# Patient Record
Sex: Female | Born: 1990 | Race: Black or African American | Hispanic: No | Marital: Single | Smoking: Former smoker
Health system: Southern US, Community
[De-identification: ages and names within clinical notes are randomized; demographics above are authoritative.]

## PROBLEM LIST (undated history)

## (undated) ENCOUNTER — Inpatient Hospital Stay (HOSPITAL_COMMUNITY): Payer: Self-pay

## (undated) DIAGNOSIS — G43909 Migraine, unspecified, not intractable, without status migrainosus: Secondary | ICD-10-CM

## (undated) DIAGNOSIS — I10 Essential (primary) hypertension: Secondary | ICD-10-CM

## (undated) DIAGNOSIS — E669 Obesity, unspecified: Secondary | ICD-10-CM

## (undated) HISTORY — DX: Migraine, unspecified, not intractable, without status migrainosus: G43.909

## (undated) HISTORY — PX: NO PAST SURGERIES: SHX2092

---

## 2015-10-06 ENCOUNTER — Encounter (HOSPITAL_COMMUNITY): Payer: Self-pay

## 2015-10-06 ENCOUNTER — Emergency Department (HOSPITAL_COMMUNITY)
Admission: EM | Admit: 2015-10-06 | Discharge: 2015-10-06 | Disposition: A | Discharge status: Home / Self Care | Payer: Self-pay | Attending: Emergency Medicine | Admitting: Emergency Medicine

## 2015-10-06 DIAGNOSIS — N390 Urinary tract infection, site not specified: Secondary | ICD-10-CM | POA: Insufficient documentation

## 2015-10-06 DIAGNOSIS — I10 Essential (primary) hypertension: Secondary | ICD-10-CM | POA: Insufficient documentation

## 2015-10-06 HISTORY — DX: Essential (primary) hypertension: I10

## 2015-10-06 LAB — URINE MICROSCOPIC-ADD ON

## 2015-10-06 LAB — URINALYSIS, ROUTINE W REFLEX MICROSCOPIC
BILIRUBIN URINE: NEGATIVE
GLUCOSE, UA: NEGATIVE mg/dL
Ketones, ur: 15 mg/dL — AB
Nitrite: NEGATIVE
PH: 6 (ref 5.0–8.0)
Protein, ur: 30 mg/dL — AB
SPECIFIC GRAVITY, URINE: 1.035 — AB (ref 1.005–1.030)

## 2015-10-06 LAB — PREGNANCY, URINE: PREG TEST UR: NEGATIVE

## 2015-10-06 MED ORDER — SULFAMETHOXAZOLE-TRIMETHOPRIM 800-160 MG PO TABS: 2.0000 | ORAL_TABLET | Freq: Two times a day (BID) | ORAL | 0 refills | Status: DC

## 2015-10-06 MED ORDER — SULFAMETHOXAZOLE-TRIMETHOPRIM 800-160 MG PO TABS
1.0000 | ORAL_TABLET | Freq: Once | ORAL | Status: AC
  Administered 2015-10-06: 1 via ORAL
  Filled 2015-10-06: qty 1

## 2015-10-06 MED ORDER — PHENAZOPYRIDINE HCL 200 MG PO TABS: 200.0000 mg | ORAL_TABLET | Freq: Three times a day (TID) | ORAL | 0 refills | Status: DC

## 2015-10-06 MED ORDER — PHENAZOPYRIDINE HCL 100 MG PO TABS
100.0000 mg | ORAL_TABLET | Freq: Once | ORAL | Status: AC
  Administered 2015-10-06: 100 mg via ORAL
  Filled 2015-10-06: qty 1

## 2015-10-07 NOTE — ED Provider Notes (Signed)
[facility]-EMERGENCY DEPT Provider Note   CSN: [id number] Arrival date & time: 10/06/15  1811  First Provider Contact:  20:00pm      History   Chief Complaint Chief Complaint  Patient presents with  . Urinary Urgency    HPI Jill Orozco is a 25 y.o. female. She reports 24 hours of burning with urination. No frank hematuria. No fevers chills back pain no nausea vomiting. No vaginal bleeding or discharge less than 2. Was 2 weeks ago. One previous UTI. HPI  Past Medical History:  Diagnosis Date  . Hypertension     There are no active problems to display for this patient.   History reviewed. No pertinent surgical history.  OB History    No data available       Home Medications    Prior to Admission medications   Medication Sig Start Date End Date Taking? Authorizing Provider  calcium carbonate (TUMS EX) 750 MG chewable tablet Chew 4-6 tablets by mouth daily as needed for heartburn.   Yes Historical Provider, MD  hydrochlorothiazide (MICROZIDE) 12.5 MG capsule Take 12.5 mg by mouth daily.   Yes Historical Provider, MD  phenazopyridine (PYRIDIUM) 200 MG tablet Take 1 tablet (200 mg total) by mouth 3 (three) times daily. 10/06/15   Rolland Porter, MD  sulfamethoxazole-trimethoprim (BACTRIM DS,SEPTRA DS) 800-160 MG tablet Take 2 tablets by mouth 2 (two) times daily. 10/06/15   Rolland Porter, MD    Family History No family history on file.  Social History Social History  Substance Use Topics  . Smoking status: Never Smoker  . Smokeless tobacco: Never Used  . Alcohol use Not on file     Allergies   Review of patient's allergies indicates no known allergies.   Review of Systems Review of Systems  Constitutional: Negative for appetite change, chills, diaphoresis, fatigue and fever.  HENT: Negative for mouth sores, sore throat and trouble swallowing.   Eyes: Negative for visual disturbance.  Respiratory: Negative for cough, chest tightness, shortness of breath and  wheezing.   Cardiovascular: Negative for chest pain.  Gastrointestinal: Negative for abdominal distention, abdominal pain, diarrhea, nausea and vomiting.  Endocrine: Negative for polydipsia, polyphagia and polyuria.  Genitourinary: Positive for dysuria, frequency and urgency. Negative for hematuria.  Musculoskeletal: Negative for gait problem.  Skin: Negative for color change, pallor and rash.  Neurological: Negative for dizziness, syncope, light-headedness and headaches.  Hematological: Does not bruise/bleed easily.  Psychiatric/Behavioral: Negative for behavioral problems and confusion.     Physical Exam Updated Vital Signs BP 111/78   Pulse 90   Temp 98.5 F (36.9 C) (Oral)   Resp 20   Wt 296 lb 5 oz (134.4 kg)   LMP 09/20/2015   SpO2 100%   Physical Exam  Constitutional: She is oriented to person, place, and time. She appears well-developed and well-nourished. No distress.  HENT:  Head: Normocephalic.  Eyes: Conjunctivae are normal. Pupils are equal, round, and reactive to light. No scleral icterus.  Neck: Normal range of motion. Neck supple. No thyromegaly present.  Cardiovascular: Normal rate and regular rhythm.  Exam reveals no gallop and no friction rub.   No murmur heard. Pulmonary/Chest: Effort normal and breath sounds normal. No respiratory distress. She has no wheezes. She has no rales.  Abdominal: Soft. Bowel sounds are normal. She exhibits no distension. There is no tenderness. There is no rebound.  Musculoskeletal: Normal range of motion.  Neurological: She is alert and oriented to person, place, and time.  Skin: Skin is  warm and dry. No rash noted.  Psychiatric: She has a normal mood and affect. Her behavior is normal.     ED Treatments / Results  Labs (all labs ordered are listed, but only abnormal results are displayed) Labs Reviewed  URINALYSIS, ROUTINE W REFLEX MICROSCOPIC (NOT AT [facility]) - Abnormal; Notable for the following:       Result Value    APPearance CLOUDY (*)    Specific Gravity, Urine 1.035 (*)    Hgb urine dipstick MODERATE (*)    Ketones, ur 15 (*)    Protein, ur 30 (*)    Leukocytes, UA MODERATE (*)    All other components within normal limits  URINE MICROSCOPIC-ADD ON - Abnormal; Notable for the following:    Squamous Epithelial / LPF 0-5 (*)    Bacteria, UA FEW (*)    All other components within normal limits  PREGNANCY, URINE  POC URINE PREG, ED    EKG  EKG Interpretation None       Radiology No results found.  Procedures Procedures (including critical care time)  Medications Ordered in ED Medications  sulfamethoxazole-trimethoprim (BACTRIM DS,SEPTRA DS) 800-160 MG per tablet 1 tablet (1 tablet Oral Given 10/06/15 2218)  phenazopyridine (PYRIDIUM) tablet 100 mg (100 mg Oral Given 10/06/15 2218)     Initial Impression / Assessment and Plan / ED Course  I have reviewed the triage vital signs and the nursing notes.  Pertinent labs & imaging results that were available during my care of the patient were reviewed by me and considered in my medical decision making (see chart for details).  Clinical Course    Urine shows obvious infection. Culture pending. HCG negative. Given Bactrim Pyridium prescriptions for the same. Push fluids stay hydrated ER with acute changes. Routine primary care follow-up.  Final Clinical Impressions(s) / ED Diagnoses   Final diagnoses:  UTI (lower urinary tract infection)    New Prescriptions Discharge Medication List as of 10/06/2015 10:08 PM    START taking these medications   Details  phenazopyridine (PYRIDIUM) 200 MG tablet Take 1 tablet (200 mg total) by mouth 3 (three) times daily., Starting Sun 10/06/2015, Print    sulfamethoxazole-trimethoprim (BACTRIM DS,SEPTRA DS) 800-160 MG tablet Take 2 tablets by mouth 2 (two) times daily., Starting Sun 10/06/2015, Print         Rolland Porter, MD 10/07/15 [phone]

## 2015-12-03 ENCOUNTER — Other Ambulatory Visit: Payer: Self-pay | Admitting: *Deleted

## 2015-12-03 ENCOUNTER — Encounter: Payer: Self-pay | Admitting: *Deleted

## 2015-12-03 DIAGNOSIS — O161 Unspecified maternal hypertension, first trimester: Secondary | ICD-10-CM

## 2015-12-03 MED ORDER — LABETALOL HCL 200 MG PO TABS: 100.0000 mg | ORAL_TABLET | Freq: Two times a day (BID) | ORAL | 3 refills | Status: DC

## 2015-12-03 NOTE — Progress Notes (Signed)
Patient presents to clinic with question about her medication. She has a new ob visit scheduled next month and takes HCTZ 12.5 daily. She is concerned that now she is pregnant she shouldn't take it. Spoke with Dorathy KinsmanVirginia Smith, CNM who changed her to labetolol 100mg  bid. Patient needs to return in one week for a BP check. Discussed this with patient who voiced understanding.

## 2015-12-10 ENCOUNTER — Ambulatory Visit: Payer: Self-pay | Admitting: *Deleted

## 2015-12-10 VITALS — BP 116/91 | HR 100

## 2015-12-10 DIAGNOSIS — O10919 Unspecified pre-existing hypertension complicating pregnancy, unspecified trimester: Secondary | ICD-10-CM

## 2015-12-10 NOTE — Progress Notes (Signed)
Pt reports she is taking Labetalol as prescribed - (50 mg twice daily).  She denies H/A or visual disturbances. She reports occasional lower abdominal cramping. Consult w/Deirdre Poe CNM - pt to continue labetalol as prescribed and keep appt as scheduled on 10/19. Pt advised to go to [facility] if she develops severe abdominal pain or heavy bleeding like a period. Pt voiced understanding of all instructions.

## 2015-12-26 ENCOUNTER — Ambulatory Visit (INDEPENDENT_AMBULATORY_CARE_PROVIDER_SITE_OTHER): Payer: Medicaid Other | Admitting: Obstetrics and Gynecology

## 2015-12-26 ENCOUNTER — Encounter: Payer: Self-pay | Admitting: Obstetrics and Gynecology

## 2015-12-26 ENCOUNTER — Other Ambulatory Visit (HOSPITAL_COMMUNITY)
Admission: RE | Admit: 2015-12-26 | Discharge: 2015-12-26 | Disposition: A | Discharge status: Home / Self Care | Payer: Medicaid Other | Source: Ambulatory Visit | Attending: Obstetrics and Gynecology | Admitting: Obstetrics and Gynecology

## 2015-12-26 VITALS — BP 118/75 | HR 102 | Ht 64.0 in | Wt 295.5 lb

## 2015-12-26 DIAGNOSIS — Z113 Encounter for screening for infections with a predominantly sexual mode of transmission: Secondary | ICD-10-CM | POA: Diagnosis not present

## 2015-12-26 DIAGNOSIS — O10911 Unspecified pre-existing hypertension complicating pregnancy, first trimester: Secondary | ICD-10-CM | POA: Diagnosis present

## 2015-12-26 DIAGNOSIS — O10919 Unspecified pre-existing hypertension complicating pregnancy, unspecified trimester: Secondary | ICD-10-CM

## 2015-12-26 DIAGNOSIS — Z3687 Encounter for antenatal screening for uncertain dates: Secondary | ICD-10-CM

## 2015-12-26 DIAGNOSIS — Z124 Encounter for screening for malignant neoplasm of cervix: Secondary | ICD-10-CM

## 2015-12-26 DIAGNOSIS — Z01419 Encounter for gynecological examination (general) (routine) without abnormal findings: Secondary | ICD-10-CM | POA: Insufficient documentation

## 2015-12-26 DIAGNOSIS — O0992 Supervision of high risk pregnancy, unspecified, second trimester: Secondary | ICD-10-CM | POA: Insufficient documentation

## 2015-12-26 DIAGNOSIS — Z3400 Encounter for supervision of normal first pregnancy, unspecified trimester: Secondary | ICD-10-CM

## 2015-12-26 LAB — POCT URINALYSIS DIP (DEVICE)
BILIRUBIN URINE: NEGATIVE
Glucose, UA: NEGATIVE mg/dL
HGB URINE DIPSTICK: NEGATIVE
KETONES UR: NEGATIVE mg/dL
LEUKOCYTES UA: NEGATIVE
NITRITE: NEGATIVE
PH: 7 (ref 5.0–8.0)
Protein, ur: NEGATIVE mg/dL
Specific Gravity, Urine: 1.015 (ref 1.005–1.030)
Urobilinogen, UA: 0.2 mg/dL (ref 0.0–1.0)

## 2015-12-26 MED ORDER — ASPIRIN 81 MG PO CHEW: 81.0000 mg | CHEWABLE_TABLET | Freq: Every day | ORAL | 6 refills | Status: DC

## 2015-12-26 NOTE — Patient Instructions (Signed)
Hypertension During Pregnancy Hypertension, or high blood pressure, is when there is extra pressure inside your blood vessels that carry blood from the heart to the rest of your body (arteries). It can happen at any time in life, including pregnancy. Hypertension during pregnancy can cause problems for you and your baby. Your baby might not weigh as much as he or she should at birth or might be born early (premature). Very bad cases of hypertension during pregnancy can be life-threatening.  Different types of hypertension can occur during pregnancy. These include:  Chronic hypertension. This happens when a woman has hypertension before pregnancy and it continues during pregnancy.  Gestational hypertension. This is when hypertension develops during pregnancy.  Preeclampsia or toxemia of pregnancy. This is a very serious type of hypertension that develops only during pregnancy. It affects the whole body and can be very dangerous for both mother and baby.  Gestational hypertension and preeclampsia usually go away after your baby is born. Your blood pressure will likely stabilize within 6 weeks. Women who have hypertension during pregnancy have a greater chance of developing hypertension later in life or with future pregnancies. RISK FACTORS There are certain factors that make it more likely for you to develop hypertension during pregnancy. These include:  Having hypertension before pregnancy.  Having hypertension during a previous pregnancy.  Being overweight.  Being older than 40 years.  Being pregnant with more than one baby.  Having diabetes or kidney problems. SIGNS AND SYMPTOMS Chronic and gestational hypertension rarely cause symptoms. Preeclampsia has symptoms, which may include:  Increased protein in your urine. Your health care provider will check for this at every prenatal visit.  Swelling of your hands and face.  Rapid weight gain.  Headaches.  Visual changes.  Being  bothered by light.  Abdominal pain, especially in the upper right area.  Chest pain.  Shortness of breath.  Increased reflexes.  Seizures. These occur with a more severe form of preeclampsia, called eclampsia. DIAGNOSIS  You may be diagnosed with hypertension during a regular prenatal exam. At each prenatal visit, you may have:  Your blood pressure checked.  A urine test to check for protein in your urine. The type of hypertension you are diagnosed with depends on when you developed it. It also depends on your specific blood pressure reading.  Developing hypertension before 20 weeks of pregnancy is consistent with chronic hypertension.  Developing hypertension after 20 weeks of pregnancy is consistent with gestational hypertension.  Hypertension with increased urinary protein is diagnosed as preeclampsia.  Blood pressure measurements that stay above 160 systolic or 110 diastolic are a sign of severe preeclampsia. TREATMENT Treatment for hypertension during pregnancy varies. Treatment depends on the type of hypertension and how serious it is.  If you take medicine for chronic hypertension, you may need to switch medicines.  Medicines called ACE inhibitors should not be taken during pregnancy.  Low-dose aspirin may be suggested for women who have risk factors for preeclampsia.  If you have gestational hypertension, you may need to take a blood pressure medicine that is safe during pregnancy. Your health care provider will recommend the correct medicine.  If you have severe preeclampsia, you may need to be in the hospital. Health care providers will watch you and your baby very closely. You also may need to take medicine called magnesium sulfate to prevent seizures and lower blood pressure.  Sometimes, an early delivery is needed. This may be the case if the condition worsens. It would be   done to protect you and your baby. The only cure for preeclampsia is delivery.  Your health  care provider may recommend that you take one low-dose aspirin (81 mg) each day to help prevent high blood pressure during your pregnancy if you are at risk for preeclampsia. You may be at risk for preeclampsia if:  You had preeclampsia or eclampsia during a previous pregnancy.  Your baby did not grow as expected during a previous pregnancy.  You experienced preterm birth with a previous pregnancy.  You experienced a separation of the placenta from the uterus (placental abruption) during a previous pregnancy.  You experienced the loss of your baby during a previous pregnancy.  You are pregnant with more than one baby.  You have other medical conditions, such as diabetes or an autoimmune disease. HOME CARE INSTRUCTIONS  Schedule and keep all of your regular prenatal care appointments. This is important.  Take medicines only as directed by your health care provider. Tell your health care provider about all medicines you take.  Eat as little salt as possible.  Get regular exercise.  Do not drink alcohol.  Do not use tobacco products.  Do not drink products with caffeine.  Lie on your left side when resting. SEEK IMMEDIATE MEDICAL CARE IF:  You have severe abdominal pain.  You have sudden swelling in your hands, ankles, or face.  You gain 4 pounds (1.8 kg) or more in 1 week.  You vomit repeatedly.  You have vaginal bleeding.  You do not feel your baby moving as much.  You have a headache.  You have blurred or double vision.  You have muscle twitching or spasms.  You have shortness of breath.  You have blue fingernails or lips.  You have blood in your urine. MAKE SURE YOU:  Understand these instructions.  Will watch your condition.  Will get help right away if you are not doing well or get worse.   This information is not intended to replace advice given to you by your health care provider. Make sure you discuss any questions you have with your health care  provider.   Document Released: 11/11/2010 Document Revised: 03/16/2014 Document Reviewed: 09/22/2012 Elsevier Interactive Patient Education [employer]  First Trimester of Pregnancy The first trimester of pregnancy is from week 1 until the end of week 12 (months 1 through 3). A week after a sperm fertilizes an egg, the egg will implant on the wall of the uterus. This embryo will begin to develop into a baby. Genes from you and your partner are forming the baby. The female genes determine whether the baby is a boy or a girl. At 6-8 weeks, the eyes and face are formed, and the heartbeat can be seen on ultrasound. At the end of 12 weeks, all the baby's organs are formed.  Now that you are pregnant, you will want to do everything you can to have a healthy baby. Two of the most important things are to get good prenatal care and to follow your health care provider's instructions. Prenatal care is all the medical care you receive before the baby's birth. This care will help prevent, find, and treat any problems during the pregnancy and childbirth. BODY CHANGES Your body goes through many changes during pregnancy. The changes vary from woman to woman.   You may gain or lose a couple of pounds at first.  You may feel sick to your stomach (nauseous) and throw up (vomit). If the vomiting is uncontrollable,  call your health care provider.  You may tire easily.  You may develop headaches that can be relieved by medicines approved by your health care provider.  You may urinate more often. Painful urination may mean you have a bladder infection.  You may develop heartburn as a result of your pregnancy.  You may develop constipation because certain hormones are causing the muscles that push waste through your intestines to slow down.  You may develop hemorrhoids or swollen, bulging veins (varicose veins).  Your breasts may begin to grow larger and become tender. Your nipples may stick out more, and  the tissue that surrounds them (areola) may become darker.  Your gums may bleed and may be sensitive to brushing and flossing.  Dark spots or blotches (chloasma, mask of pregnancy) may develop on your face. This will likely fade after the baby is born.  Your menstrual periods will stop.  You may have a loss of appetite.  You may develop cravings for certain kinds of food.  You may have changes in your emotions from day to day, such as being excited to be pregnant or being concerned that something may go wrong with the pregnancy and baby.  You may have more vivid and strange dreams.  You may have changes in your hair. These can include thickening of your hair, rapid growth, and changes in texture. Some women also have hair loss during or after pregnancy, or hair that feels dry or thin. Your hair will most likely return to normal after your baby is born. WHAT TO EXPECT AT YOUR PRENATAL VISITS During a routine prenatal visit:  You will be weighed to make sure you and the baby are growing normally.  Your blood pressure will be taken.  Your abdomen will be measured to track your baby's growth.  The fetal heartbeat will be listened to starting around week 10 or 12 of your pregnancy.  Test results from any previous visits will be discussed. Your health care provider may ask you:  How you are feeling.  If you are feeling the baby move.  If you have had any abnormal symptoms, such as leaking fluid, bleeding, severe headaches, or abdominal cramping.  If you are using any tobacco products, including cigarettes, chewing tobacco, and electronic cigarettes.  If you have any questions. Other tests that may be performed during your first trimester include:  Blood tests to find your blood type and to check for the presence of any previous infections. They will also be used to check for low iron levels (anemia) and Rh antibodies. Later in the pregnancy, blood tests for diabetes will be done  along with other tests if problems develop.  Urine tests to check for infections, diabetes, or protein in the urine.  An ultrasound to confirm the proper growth and development of the baby.  An amniocentesis to check for possible genetic problems.  Fetal screens for spina bifida and Down syndrome.  You may need other tests to make sure you and the baby are doing well.  HIV (human immunodeficiency virus) testing. Routine prenatal testing includes screening for HIV, unless you choose not to have this test. HOME CARE INSTRUCTIONS  Medicines  Follow your health care provider's instructions regarding medicine use. Specific medicines may be either safe or unsafe to take during pregnancy.  Take your prenatal vitamins as directed.  If you develop constipation, try taking a stool softener if your health care provider approves. Diet  Eat regular, well-balanced meals. Choose a variety of  foods, such as meat or vegetable-based protein, fish, milk and low-fat dairy products, vegetables, fruits, and whole grain breads and cereals. Your health care provider will help you determine the amount of weight gain that is right for you.  Avoid raw meat and uncooked cheese. These carry germs that can cause birth defects in the baby.  Eating four or five small meals rather than three large meals a day may help relieve nausea and vomiting. If you start to feel nauseous, eating a few soda crackers can be helpful. Drinking liquids between meals instead of during meals also seems to help nausea and vomiting.  If you develop constipation, eat more high-fiber foods, such as fresh vegetables or fruit and whole grains. Drink enough fluids to keep your urine clear or pale yellow. Activity and Exercise  Exercise only as directed by your health care provider. Exercising will help you:  Control your weight.  Stay in shape.  Be prepared for labor and delivery.  Experiencing pain or cramping in the lower abdomen or  low back is a good sign that you should stop exercising. Check with your health care provider before continuing normal exercises.  Try to avoid standing for long periods of time. Move your legs often if you must stand in one place for a long time.  Avoid heavy lifting.  Wear low-heeled shoes, and practice good posture.  You may continue to have sex unless your health care provider directs you otherwise. Relief of Pain or Discomfort  Wear a good support bra for breast tenderness.   Take warm sitz baths to soothe any pain or discomfort caused by hemorrhoids. Use hemorrhoid cream if your health care provider approves.   Rest with your legs elevated if you have leg cramps or low back pain.  If you develop varicose veins in your legs, wear support hose. Elevate your feet for 15 minutes, 3-4 times a day. Limit salt in your diet. Prenatal Care  Schedule your prenatal visits by the twelfth week of pregnancy. They are usually scheduled monthly at first, then more often in the last 2 months before delivery.  Write down your questions. Take them to your prenatal visits.  Keep all your prenatal visits as directed by your health care provider. Safety  Wear your seat belt at all times when driving.  Make a list of emergency phone numbers, including numbers for family, friends, the hospital, and police and fire departments. General Tips  Ask your health care provider for a referral to a local prenatal education class. Begin classes no later than at the beginning of month 6 of your pregnancy.  Ask for help if you have counseling or nutritional needs during pregnancy. Your health care provider can offer advice or refer you to specialists for help with various needs.  Do not use hot tubs, steam rooms, or saunas.  Do not douche or use tampons or scented sanitary pads.  Do not cross your legs for long periods of time.  Avoid cat litter boxes and soil used by cats. These carry germs that can  cause birth defects in the baby and possibly loss of the fetus by miscarriage or stillbirth.  Avoid all smoking, herbs, alcohol, and medicines not prescribed by your health care provider. Chemicals in these affect the formation and growth of the baby.  Do not use any tobacco products, including cigarettes, chewing tobacco, and electronic cigarettes. If you need help quitting, ask your health care provider. You may receive counseling support and other resources  to help you quit.  Schedule a dentist appointment. At home, brush your teeth with a soft toothbrush and be gentle when you floss. SEEK MEDICAL CARE IF:   You have dizziness.  You have mild pelvic cramps, pelvic pressure, or nagging pain in the abdominal area.  You have persistent nausea, vomiting, or diarrhea.  You have a bad smelling vaginal discharge.  You have pain with urination.  You notice increased swelling in your face, hands, legs, or ankles. SEEK IMMEDIATE MEDICAL CARE IF:   You have a fever.  You are leaking fluid from your vagina.  You have spotting or bleeding from your vagina.  You have severe abdominal cramping or pain.  You have rapid weight gain or loss.  You vomit blood or material that looks like coffee grounds.  You are exposed to [place] measles and have never had them.  You are exposed to fifth disease or chickenpox.  You develop a severe headache.  You have shortness of breath.  You have any kind of trauma, such as from a fall or a car accident.   This information is not intended to replace advice given to you by your health care provider. Make sure you discuss any questions you have with your health care provider.   Document Released: 02/17/2001 Document Revised: 03/16/2014 Document Reviewed: 01/03/2013 Elsevier Interactive Patient Education [employer]

## 2015-12-26 NOTE — Progress Notes (Signed)
   PRENATAL VISIT NOTE  Subjective:  Jill Orozco is a 25 y.o. G1P0 at 96w5dbeing seen today for initial prenatal care.  She is currently monitored for the following issues for this high-risk pregnancy and has Chronic hypertension during pregnancy, antepartum and Supervision of normal pregnancy on her problem list.   Pt was taking 266mHCTZ prior to pregancy. She has transitioned to 10029mf labetolol BID. Her blood pressure has been well controlled  Patient reports no complaints.  Contractions: Not present. Vag. Bleeding: None.  Movement: Present. Denies leaking of fluid.   The following portions of the patient's history were reviewed and updated as appropriate: allergies, current medications, past family history, past medical history, past social history, past surgical history and problem list. Problem list updated.  Objective:   Vitals:   12/26/15 1304 12/26/15 1306  BP: 118/75   Pulse: (!) 102   Weight: 295 lb 8 oz (134 kg)   Height:  _0  (1.626 m)    Fetal Status: Fetal Heart Rate (bpm): 158   Movement: Present     General:  Alert, oriented and cooperative. Patient is in no acute distress.  Skin: Skin is warm and dry. No rash noted.   Cardiovascular: Normal heart rate noted  Respiratory: Normal respiratory effort, no problems with respiration noted  Abdomen: Soft, gravid, appropriate for gestational age. Pain/Pressure: Present     Pelvic:  Cervical exam deferred        Extremities: Normal range of motion.  Edema: Trace  Mental Status: Normal mood and affect. Normal behavior. Normal judgment and thought content.   Assessment and Plan:  Pregnancy: G1P0 at 13w37w5d Supervision of normal first pregnancy, antepartum - Glucose Tolerance, 1 HR (50g) - Prenatal Profile - Hemoglobinopathy Evaluation - GC[facility]lam[facility] - PAP - Pain Mgmt, Profile 6 Conf w/o mM, U - Culture, OB Urine - US OKoreaComp Less 14 Wks; Future - Protein  / Creatinine Ratio, Urine - Comp Met (CMET)  2. Chronic hypertension during pregnancy, antepartum Pt with cHTN, controlled at this time on labetolol 100mg43m - Protein / Creatinine Ratio, Urine - CMP -start baby asa daily  3. Unsure of LMP (last menstrual period) as reason for ultrasound scan Pt uterus exams >13 wks which her LMP sugests. Addtionally she reports fetal movement. Will get US.  [place]US OBKoreaomp Less 14 Wks; Future  Preterm labor symptoms and general obstetric precautions including but not limited to vaginal bleeding, contractions, leaking of fluid and fetal movement were reviewed in detail with the patient. Please refer to After Visit Summary for other counseling recommendations.  No Follow-up on file.  NichoWaldemar Dickens

## 2015-12-26 NOTE — Progress Notes (Signed)
Here for initial prenatal visit.  Declines flu shot. Given prenatal education booklets.

## 2015-12-27 LAB — PRENATAL PROFILE (SOLSTAS)
ANTIBODY SCREEN: NEGATIVE
BASOS ABS: 42 {cells}/uL (ref 0–200)
BASOS PCT: 1 %
EOS ABS: 42 {cells}/uL (ref 15–500)
EOS PCT: 1 %
HCT: 34.8 % — ABNORMAL LOW (ref 35.0–45.0)
HEMOGLOBIN: 11.3 g/dL — AB (ref 11.7–15.5)
HEP B S AG: NEGATIVE
HIV 1&2 Ab, 4th Generation: NONREACTIVE
LYMPHS ABS: 924 {cells}/uL (ref 850–3900)
Lymphocytes Relative: 22 %
MCH: 26.8 pg — AB (ref 27.0–33.0)
MCHC: 32.5 g/dL (ref 32.0–36.0)
MCV: 82.7 fL (ref 80.0–100.0)
MONOS PCT: 14 %
MPV: 9.9 fL (ref 7.5–12.5)
Monocytes Absolute: 588 cells/uL (ref 200–950)
NEUTROS ABS: 2604 {cells}/uL (ref 1500–7800)
Neutrophils Relative %: 62 %
Platelets: 184 10*3/uL (ref 140–400)
RBC: 4.21 MIL/uL (ref 3.80–5.10)
RDW: 15.6 % — ABNORMAL HIGH (ref 11.0–15.0)
RUBELLA: 4.18 {index} — AB (ref <0.90)
Rh Type: POSITIVE
WBC: 4.2 10*3/uL (ref 3.8–10.8)

## 2015-12-27 LAB — COMPREHENSIVE METABOLIC PANEL
ALBUMIN: 4 g/dL (ref 3.6–5.1)
ALK PHOS: 35 U/L (ref 33–115)
ALT: 12 U/L (ref 6–29)
AST: 15 U/L (ref 10–30)
BILIRUBIN TOTAL: 0.6 mg/dL (ref 0.2–1.2)
BUN: 5 mg/dL — AB (ref 7–25)
CHLORIDE: 104 mmol/L (ref 98–110)
CO2: 24 mmol/L (ref 20–31)
CREATININE: 0.53 mg/dL (ref 0.50–1.10)
Calcium: 9.3 mg/dL (ref 8.6–10.2)
Glucose, Bld: 95 mg/dL (ref 65–99)
Potassium: 3.8 mmol/L (ref 3.5–5.3)
SODIUM: 137 mmol/L (ref 135–146)
TOTAL PROTEIN: 6.7 g/dL (ref 6.1–8.1)

## 2015-12-27 LAB — GC/CHLAMYDIA PROBE AMP (~~LOC~~) NOT AT ARMC
CHLAMYDIA, DNA PROBE: NEGATIVE
NEISSERIA GONORRHEA: NEGATIVE

## 2015-12-27 LAB — PAIN MGMT, PROFILE 6 CONF W/O MM, U
6 ACETYLMORPHINE: NEGATIVE ng/mL (ref <10)
ALCOHOL METABOLITES: NEGATIVE ng/mL (ref <500)
Amphetamines: NEGATIVE ng/mL (ref <500)
Barbiturates: NEGATIVE ng/mL (ref <300)
Benzodiazepines: NEGATIVE ng/mL (ref <100)
CREATININE: 179 mg/dL (ref >=20.0)
Cocaine Metabolite: NEGATIVE ng/mL (ref <150)
Marijuana Metabolite: NEGATIVE ng/mL (ref <20)
Methadone Metabolite: NEGATIVE ng/mL (ref <100)
OXIDANT: NEGATIVE ug/mL (ref <200)
Opiates: NEGATIVE ng/mL (ref <100)
Oxycodone: NEGATIVE ng/mL (ref <100)
PH: 6.5 (ref 4.5–9.0)
PLEASE NOTE: 0
Phencyclidine: NEGATIVE ng/mL (ref <25)

## 2015-12-27 LAB — GLUCOSE TOLERANCE, 1 HOUR (50G) W/O FASTING: Glucose, 1 Hr, gestational: 98 mg/dL (ref <140)

## 2015-12-27 LAB — PROTEIN / CREATININE RATIO, URINE
CREATININE, URINE: 211 mg/dL (ref 20–320)
Protein Creatinine Ratio: 81 mg/g creat (ref 21–161)
TOTAL PROTEIN, URINE: 17 mg/dL (ref 5–24)

## 2015-12-27 LAB — CYTOLOGY - PAP: Diagnosis: NEGATIVE

## 2015-12-28 LAB — CULTURE, OB URINE
COLONY COUNT: NO GROWTH
ORGANISM ID, BACTERIA: NO GROWTH

## 2015-12-30 LAB — HEMOGLOBINOPATHY EVALUATION
HEMATOCRIT: 34.8 % — AB (ref 35.0–45.0)
HEMOGLOBIN: 11.3 g/dL — AB (ref 11.7–15.5)
HGB A2 QUANT: 2.1 % (ref 1.8–3.5)
Hgb A: 96.9 % (ref >96.0)
Hgb F Quant: <1 % (ref <2.0)
MCH: 26.8 pg — ABNORMAL LOW (ref 27.0–33.0)
MCV: 82.7 fL (ref 80.0–100.0)
RBC: 4.21 MIL/uL (ref 3.80–5.10)
RDW: 15.6 % — ABNORMAL HIGH (ref 11.0–15.0)

## 2015-12-31 ENCOUNTER — Ambulatory Visit (HOSPITAL_COMMUNITY)
Admission: RE | Admit: 2015-12-31 | Discharge: 2015-12-31 | Disposition: A | Discharge status: Home / Self Care | Payer: Medicaid Other | Source: Ambulatory Visit | Attending: Obstetrics and Gynecology | Admitting: Obstetrics and Gynecology

## 2015-12-31 DIAGNOSIS — Z3687 Encounter for antenatal screening for uncertain dates: Secondary | ICD-10-CM

## 2015-12-31 DIAGNOSIS — Z3400 Encounter for supervision of normal first pregnancy, unspecified trimester: Secondary | ICD-10-CM

## 2015-12-31 DIAGNOSIS — Z362 Encounter for other antenatal screening follow-up: Secondary | ICD-10-CM | POA: Diagnosis present

## 2015-12-31 DIAGNOSIS — Z3A14 14 weeks gestation of pregnancy: Secondary | ICD-10-CM | POA: Diagnosis not present

## 2016-01-23 ENCOUNTER — Ambulatory Visit (INDEPENDENT_AMBULATORY_CARE_PROVIDER_SITE_OTHER): Payer: Medicaid Other | Admitting: Obstetrics and Gynecology

## 2016-01-23 ENCOUNTER — Other Ambulatory Visit: Payer: Self-pay

## 2016-01-23 VITALS — BP 134/69 | HR 107 | Wt 294.2 lb

## 2016-01-23 DIAGNOSIS — Z6841 Body Mass Index (BMI) 40.0 and over, adult: Secondary | ICD-10-CM

## 2016-01-23 DIAGNOSIS — O10919 Unspecified pre-existing hypertension complicating pregnancy, unspecified trimester: Secondary | ICD-10-CM

## 2016-01-23 DIAGNOSIS — O10912 Unspecified pre-existing hypertension complicating pregnancy, second trimester: Secondary | ICD-10-CM | POA: Diagnosis not present

## 2016-01-23 DIAGNOSIS — Z3400 Encounter for supervision of normal first pregnancy, unspecified trimester: Secondary | ICD-10-CM

## 2016-01-23 DIAGNOSIS — E669 Obesity, unspecified: Secondary | ICD-10-CM

## 2016-01-23 DIAGNOSIS — O99212 Obesity complicating pregnancy, second trimester: Secondary | ICD-10-CM

## 2016-01-23 DIAGNOSIS — O0992 Supervision of high risk pregnancy, unspecified, second trimester: Secondary | ICD-10-CM

## 2016-01-23 DIAGNOSIS — O99210 Obesity complicating pregnancy, unspecified trimester: Secondary | ICD-10-CM | POA: Insufficient documentation

## 2016-01-23 LAB — POCT URINALYSIS DIP (DEVICE)
BILIRUBIN URINE: NEGATIVE
Glucose, UA: NEGATIVE mg/dL
HGB URINE DIPSTICK: NEGATIVE
KETONES UR: NEGATIVE mg/dL
Leukocytes, UA: NEGATIVE
Nitrite: NEGATIVE
PH: 6.5 (ref 5.0–8.0)
PROTEIN: NEGATIVE mg/dL
SPECIFIC GRAVITY, URINE: 1.01 (ref 1.005–1.030)
Urobilinogen, UA: 0.2 mg/dL (ref 0.0–1.0)

## 2016-01-24 LAB — AFP, QUAD SCREEN
AFP: 34.6 ng/mL
Age Alone: 1:912 {titer}
CURR GEST AGE: 17.7 wk
HCG, Total: 25.5 IU/mL
INH: 122 pg/mL
Interpretation-AFP: NEGATIVE
MOM FOR HCG: 1.32
MoM for AFP: 1.23
MoM for INH: 1.06
Open Spina bifida: NEGATIVE
Osb Risk: 1:12300 {titer}
TRI 18 SCR RISK EST: NEGATIVE
UE3 VALUE: 0.82 ng/mL
uE3 Mom: 0.87

## 2016-01-24 NOTE — Progress Notes (Signed)
Prenatal Visit Note Date: 01/24/2016 [facility]  Subjective:  Jill Orozco is a 25 y.o. G1P0 at 6583w6d being seen today for ongoing prenatal care.  She is currently monitored for the following issues for this high-risk pregnancy and has Chronic hypertension during pregnancy, antepartum; Supervision of high risk pregnancy, antepartum, second trimester; Obesity in pregnancy; and BMI 50.0-59.9, adult (HCC) on her problem list.  Patient reports no complaints.   Contractions: Not present. Vag. Bleeding: None.  Movement: Present. Denies leaking of fluid.   The following portions of the patient's history were reviewed and updated as appropriate: allergies, current medications, past family history, past medical history, past social history, past surgical history and problem list. Problem list updated.  Objective:   Vitals:   01/23/16 1101 01/23/16 1110  BP: 130/84 134/69  Pulse: (!) 112 (!) 107  Weight: 294 lb 3.2 oz (133.4 kg)     Fetal Status: Fetal Heart Rate (bpm): 154   Movement: Present     General:  Alert, oriented and cooperative. Patient is in no acute distress.  Skin: Skin is warm and dry. No rash noted.   Cardiovascular: Normal heart rate noted  Respiratory: Normal respiratory effort, no problems with respiration noted  Abdomen: Soft, gravid, appropriate for gestational age. Pain/Pressure: Present     Pelvic:  Cervical exam deferred        Extremities: Normal range of motion.  Edema: Trace  Mental Status: Normal mood and affect. Normal behavior. Normal judgment and thought content.   Urinalysis:      Assessment and Plan:  Pregnancy: G1 at 2983w6d  1. Chronic hypertension during pregnancy, antepartum Baseline labs negative. Will get baseline ECG.  - EKG 12-Lead  2. Supervision of normal first pregnancy, antepartum Anatomy scan ordered for 18-20wks. Pt amenable to quad screen  - US MFM OB COMP + 14 WK; Future  3. Obesity in  pregnancy No change in plan of care. Early glucola negative.   Preterm labor symptoms and general obstetric precautions including but not limited to vaginal bleeding, contractions, leaking of fluid and fetal movement were reviewed in detail with the patient. Please refer to After Visit Summary for other counseling recommendations.  Return in about 3 weeks (around 02/13/2016).   Wilkinson Bingharlie Hanan Mcwilliams, MD

## 2016-02-06 ENCOUNTER — Other Ambulatory Visit: Payer: Self-pay | Admitting: Obstetrics and Gynecology

## 2016-02-06 ENCOUNTER — Ambulatory Visit (HOSPITAL_COMMUNITY)
Admission: RE | Admit: 2016-02-06 | Discharge: 2016-02-06 | Disposition: A | Discharge status: Home / Self Care | Payer: Medicaid Other | Source: Ambulatory Visit | Attending: Obstetrics and Gynecology | Admitting: Obstetrics and Gynecology

## 2016-02-06 ENCOUNTER — Encounter (HOSPITAL_COMMUNITY): Payer: Self-pay

## 2016-02-06 DIAGNOSIS — O0992 Supervision of high risk pregnancy, unspecified, second trimester: Secondary | ICD-10-CM

## 2016-02-06 DIAGNOSIS — Z3A19 19 weeks gestation of pregnancy: Secondary | ICD-10-CM | POA: Diagnosis not present

## 2016-02-06 DIAGNOSIS — O10919 Unspecified pre-existing hypertension complicating pregnancy, unspecified trimester: Secondary | ICD-10-CM

## 2016-02-06 DIAGNOSIS — O10912 Unspecified pre-existing hypertension complicating pregnancy, second trimester: Secondary | ICD-10-CM | POA: Insufficient documentation

## 2016-02-06 DIAGNOSIS — Z3400 Encounter for supervision of normal first pregnancy, unspecified trimester: Secondary | ICD-10-CM

## 2016-02-06 DIAGNOSIS — O99212 Obesity complicating pregnancy, second trimester: Secondary | ICD-10-CM

## 2016-02-06 DIAGNOSIS — Z6841 Body Mass Index (BMI) 40.0 and over, adult: Secondary | ICD-10-CM | POA: Diagnosis not present

## 2016-02-10 ENCOUNTER — Telehealth: Payer: Self-pay | Admitting: *Deleted

## 2016-02-10 ENCOUNTER — Other Ambulatory Visit (HOSPITAL_COMMUNITY): Payer: Self-pay | Admitting: *Deleted

## 2016-02-10 DIAGNOSIS — O10919 Unspecified pre-existing hypertension complicating pregnancy, unspecified trimester: Secondary | ICD-10-CM

## 2016-02-10 NOTE — Telephone Encounter (Signed)
Jill Orozco left a message this am stating she wants to know if she can get something for a cold- states she has had a cough for 2 weeks.   I called Hayden PedroLacresha and we discussed what meds she can take since she is pregnant and has hypertension. Delsym, robitussin plain/DM/ mucinex. She voices understanding. We also discussed she does not have a prenatal visit scheduled. Will send message for registars to call her with appt.

## 2016-02-14 ENCOUNTER — Ambulatory Visit (INDEPENDENT_AMBULATORY_CARE_PROVIDER_SITE_OTHER): Payer: Medicaid Other | Admitting: Family Medicine

## 2016-02-14 VITALS — BP 120/74 | HR 106 | Wt 294.2 lb

## 2016-02-14 DIAGNOSIS — O10912 Unspecified pre-existing hypertension complicating pregnancy, second trimester: Secondary | ICD-10-CM

## 2016-02-14 DIAGNOSIS — O10919 Unspecified pre-existing hypertension complicating pregnancy, unspecified trimester: Secondary | ICD-10-CM

## 2016-02-14 DIAGNOSIS — O0992 Supervision of high risk pregnancy, unspecified, second trimester: Secondary | ICD-10-CM

## 2016-02-14 NOTE — Progress Notes (Signed)
   PRENATAL VISIT NOTE  Subjective:  Jill Orozco is a 25 y.o. G1P0 at 8249w6d being seen today for ongoing prenatal care.  She is currently monitored for the following issues for this high-risk pregnancy and has Chronic hypertension during pregnancy, antepartum; Supervision of high risk pregnancy, antepartum, second trimester; Obesity in pregnancy; and BMI 50.0-59.9, adult (HCC) on her problem list.  Patient reports no bleeding and no contractions.  Contractions: Not present.  .  Movement: Present. Denies leaking of fluid.   The following portions of the patient's history were reviewed and updated as appropriate: allergies, current medications, past family history, past medical history, past social history, past surgical history and problem list. Problem list updated.  Objective:   Vitals:   02/14/16 0944  BP: 120/74  Pulse: (!) 106  Weight: 294 lb 3.2 oz (133.4 kg)    Fetal Status: Fetal Heart Rate (bpm): 151   Movement: Present     General:  Alert, oriented and cooperative. Patient is in no acute distress.  Skin: Skin is warm and dry. No rash noted.   Cardiovascular: Normal heart rate noted  Respiratory: Normal respiratory effort, no problems with respiration noted  Abdomen: Soft, gravid, appropriate for gestational age. Pain/Pressure: Absent     Pelvic:  Cervical exam deferred        Extremities: Normal range of motion.     Mental Status: Normal mood and affect. Normal behavior. Normal judgment and thought content.   Assessment and Plan:  Pregnancy: G1P0 at 249w6d  1. Supervision of high risk pregnancy, antepartum, second trimester FHT and FH normal.  2. Chronic hypertension during pregnancy, antepartum Continue Labetalol, ASA. US for growth on 1/11.  Preterm labor symptoms and general obstetric precautions including but not limited to vaginal bleeding, contractions, leaking of fluid and fetal movement were reviewed in detail with the patient. Please refer to After  Visit Summary for other counseling recommendations.  No Follow-up on file.   Levie HeritageJacob J Adarsh Mundorf, DO

## 2016-03-09 NOTE — L&D Delivery Note (Signed)
Delivery Note At 12:58 PM a viable female was delivered via  (Presentation: vertex; ROA).  APGAR: 9, 9; weight pending.   Placenta status: intact via Tomasa Blase.  Cord:  3VC with no complications.  Cord pH: n/a  Anesthesia:  epidural Episiotomy:  n/a Lacerations:  1st degree vaginal Suture Repair: 2.0 vicryl rapide Est. Blood Loss (mL):  250  Mom to postpartum.  Baby to Couplet care / Skin to Skin.  Raelyn Mora, M MSN, CNM 06/21/2016, 1:09 PM

## 2016-03-12 ENCOUNTER — Encounter: Payer: Medicaid Other | Admitting: Obstetrics and Gynecology

## 2016-03-19 ENCOUNTER — Encounter (HOSPITAL_COMMUNITY): Payer: Self-pay

## 2016-03-19 ENCOUNTER — Other Ambulatory Visit (HOSPITAL_COMMUNITY): Payer: Self-pay | Admitting: Maternal and Fetal Medicine

## 2016-03-19 ENCOUNTER — Ambulatory Visit (HOSPITAL_COMMUNITY)
Admission: RE | Admit: 2016-03-19 | Discharge: 2016-03-19 | Disposition: A | Discharge status: Home / Self Care | Payer: Medicaid Other | Source: Ambulatory Visit | Attending: Obstetrics and Gynecology | Admitting: Obstetrics and Gynecology

## 2016-03-19 DIAGNOSIS — O0992 Supervision of high risk pregnancy, unspecified, second trimester: Secondary | ICD-10-CM

## 2016-03-19 DIAGNOSIS — O10012 Pre-existing essential hypertension complicating pregnancy, second trimester: Secondary | ICD-10-CM | POA: Diagnosis not present

## 2016-03-19 DIAGNOSIS — O10919 Unspecified pre-existing hypertension complicating pregnancy, unspecified trimester: Secondary | ICD-10-CM

## 2016-03-19 DIAGNOSIS — Z3A25 25 weeks gestation of pregnancy: Secondary | ICD-10-CM | POA: Diagnosis not present

## 2016-03-19 DIAGNOSIS — O99212 Obesity complicating pregnancy, second trimester: Secondary | ICD-10-CM | POA: Insufficient documentation

## 2016-03-20 ENCOUNTER — Other Ambulatory Visit (HOSPITAL_COMMUNITY): Payer: Self-pay | Admitting: *Deleted

## 2016-03-20 DIAGNOSIS — O10919 Unspecified pre-existing hypertension complicating pregnancy, unspecified trimester: Secondary | ICD-10-CM

## 2016-03-23 ENCOUNTER — Ambulatory Visit (INDEPENDENT_AMBULATORY_CARE_PROVIDER_SITE_OTHER): Payer: Medicaid Other | Admitting: Family Medicine

## 2016-03-23 VITALS — BP 132/79 | HR 112 | Wt 295.0 lb

## 2016-03-23 DIAGNOSIS — O10919 Unspecified pre-existing hypertension complicating pregnancy, unspecified trimester: Secondary | ICD-10-CM

## 2016-03-23 DIAGNOSIS — O0992 Supervision of high risk pregnancy, unspecified, second trimester: Secondary | ICD-10-CM

## 2016-03-23 DIAGNOSIS — O10912 Unspecified pre-existing hypertension complicating pregnancy, second trimester: Secondary | ICD-10-CM

## 2016-03-23 NOTE — Patient Instructions (Addendum)
AREA PEDIATRIC/FAMILY PRACTICE PHYSICIANS  [facility] [address] [place], [place]  [address] Phone - [phone]   Fax - [phone]  [facility] [address] [place], [place] [address] Phone - [phone]   Fax - [phone]  JACK AMOS [address] [place], [place]  [address] Phone - [phone]   Fax - [phone]  [facility] [address] [place], [place]  [address] Phone - [phone]   Fax - [phone]  [facility] [address] [place], [place]  [address] Phone - [phone]   Fax - [phone]  [facility] [address] [place], [place]  [address] Phone - [phone]   Fax - [phone]  [facility] [place] [facility] [address] [place], [place]  [address] Phone - [phone]   Fax - [phone]  [facility] [address] [place], [address] [place], [place]  [address] Phone - [phone]   Fax - [phone]  [facility] AT [facility] [address] [place], [place]  [address] Phone - [phone]   Fax - [phone] [facility] [place] [address] [place], [place]  [address] Phone - [phone]   Fax - [phone]  [facility] [address] [place], [place]  [address] Phone - [phone]   Fax - [phone]  [facility] [address], [address] [place], [place]  [address] Phone - [phone]   Fax - [phone]  [facility] [address] [place], [place]  [address] Phone - [phone]   Fax - [phone]  [facility] [address], [address] [place], [place]  [address] Phone - [phone]  [facility] [address] [place], [place]  [address] Phone - [phone]   Fax - [phone]  [facility] [address] [place], [place]  [address] Phone - [phone]   Fax - [phone]  [facility] [address] [place], [place]  [address] Phone - [phone]   Fax - [phone]  [facility] [address] [place], [place]  [address] Phone - [phone]   Fax - [phone]   [facility] [address] [place], [place]  [address] Phone - [phone]   Fax - [phone]  [facility] [address] [place], [place]  [address] Phone - [phone]   Fax - [phone]  DAVID RUBIN [address] [place], [place]  [address] Phone - [phone]   Fax - [phone]  [facility] [address] [place], [place]  [address] Phone - [phone]   Fax - [phone]  [facility] Alita Chyle [address] [place], [place]  [address] Phone - [phone]   Fax - [phone] Gerarda Fraction [address] [place], [place]  [address] Phone - [phone]   Fax - [phone]  [facility] [address] [place], [place]  [address] Phone - [phone]   Fax - [phone]  [facility] [place] [address] [place], [place]  [address] Phone - [phone]   Fax - [phone]     Second Trimester of Pregnancy The second trimester is from week 13 through week 28 (months 4 through 6). The second trimester is often a time when you feel your best. Your  body has also adjusted to being pregnant, and you begin to feel better physically. Usually, morning sickness has lessened or quit completely, you may have more energy, and you may have an increase in appetite. The second trimester is also a time when the fetus is growing rapidly. At the end of the sixth month, the fetus is about 9 inches long and weighs about 1 pounds. You will likely begin to feel the baby move (quickening) between 18 and 20 weeks of the  pregnancy. Body changes during your second trimester Your body continues to go through many changes during your second trimester. The changes vary from woman to woman.  Your weight will continue to increase. You will notice your lower abdomen bulging out.  You may begin to get stretch marks on your hips, abdomen, and breasts.  You may develop headaches that can be relieved by medicines. The medicines should be approved by your health care provider.  You may urinate more often because the fetus is pressing on your bladder.  You may develop or continue to have heartburn as a result of your pregnancy.  You may develop constipation because certain hormones are causing the muscles that push waste through your intestines to slow down.  You may develop hemorrhoids or swollen, bulging veins (varicose veins).  You may have back pain. This is caused by:  Weight gain.  Pregnancy hormones that are relaxing the joints in your pelvis.  A shift in weight and the muscles that support your balance.  Your breasts will continue to grow and they will continue to become tender.  Your gums may bleed and may be sensitive to brushing and flossing.  Dark spots or blotches (chloasma, mask of pregnancy) may develop on your face. This will likely fade after the baby is born.  A dark line from your belly button to the pubic area (linea nigra) may appear. This will likely fade after the baby is born.  You may have changes in your hair. These can include thickening of your hair, rapid growth, and changes in texture. Some women also have hair loss during or after pregnancy, or hair that feels dry or thin. Your hair will most likely return to normal after your baby is born. What to expect at prenatal visits During a routine prenatal visit:  You will be weighed to make sure you and the fetus are growing normally.  Your blood pressure will be taken.  Your abdomen will be measured to track your baby's  growth.  The fetal heartbeat will be listened to.  Any test results from the previous visit will be discussed. Your health care provider may ask you:  How you are feeling.  If you are feeling the baby move.  If you have had any abnormal symptoms, such as leaking fluid, bleeding, severe headaches, or abdominal cramping.  If you are using any tobacco products, including cigarettes, chewing tobacco, and electronic cigarettes.  If you have any questions. Other tests that may be performed during your second trimester include:  Blood tests that check for:  Low iron levels (anemia).  Gestational diabetes (between 24 and 28 weeks).  Rh antibodies. This is to check for a protein on red blood cells (Rh factor).  Urine tests to check for infections, diabetes, or protein in the urine.  An ultrasound to confirm the proper growth and development of the baby.  An amniocentesis to check for possible genetic problems.  Fetal screens for spina bifida and Down syndrome.  HIV (human  immunodeficiency virus) testing. Routine prenatal testing includes screening for HIV, unless you choose not to have this test. Follow these instructions at home: Eating and drinking  Continue to eat regular, healthy meals.  Avoid raw meat, uncooked cheese, cat litter boxes, and soil used by cats. These carry germs that can cause birth defects in the baby.  Take your prenatal vitamins.  Take 1500-2000 mg of calcium daily starting at the 20th week of pregnancy until you deliver your baby.  If you develop constipation:  Take over-the-counter or prescription medicines.  Drink enough fluid to keep your urine clear or pale yellow.  Eat foods that are high in fiber, such as fresh fruits and vegetables, whole grains, and beans.  Limit foods that are high in fat and processed sugars, such as fried and sweet foods. Activity  Exercise only as directed by your health care provider. Experiencing uterine cramps is  a good sign to stop exercising.  Avoid heavy lifting, wear low heel shoes, and practice good posture.  Wear your seat belt at all times when driving.  Rest with your legs elevated if you have leg cramps or low back pain.  Wear a good support bra for breast tenderness.  Do not use hot tubs, steam rooms, or saunas. Lifestyle  Avoid all smoking, herbs, alcohol, and unprescribed drugs. These chemicals affect the formation and growth of the baby.  Do not use any products that contain nicotine or tobacco, such as cigarettes and e-cigarettes. If you need help quitting, ask your health care provider.  A sexual relationship may be continued unless your health care provider directs you otherwise. General instructions  Follow your health care provider's instructions regarding medicine use. There are medicines that are either safe or unsafe to take during pregnancy.  Take warm sitz baths to soothe any pain or discomfort caused by hemorrhoids. Use hemorrhoid cream if your health care provider approves.  If you develop varicose veins, wear support hose. Elevate your feet for 15 minutes, 3-4 times a day. Limit salt in your diet.  Visit your dentist if you have not gone yet during your pregnancy. Use a soft toothbrush to brush your teeth and be gentle when you floss.  Keep all follow-up prenatal visits as told by your health care provider. This is important. Contact a health care provider if:  You have dizziness.  You have mild pelvic cramps, pelvic pressure, or nagging pain in the abdominal area.  You have persistent nausea, vomiting, or diarrhea.  You have a bad smelling vaginal discharge.  You have pain with urination. Get help right away if:  You have a fever.  You are leaking fluid from your vagina.  You have spotting or bleeding from your vagina.  You have severe abdominal cramping or pain.  You have rapid weight gain or weight loss.  You have shortness of breath with chest  pain.  You notice sudden or extreme swelling of your face, hands, ankles, feet, or legs.  You have not felt your baby move in over an hour.  You have severe headaches that do not go away with medicine.  You have vision changes. Summary  The second trimester is from week 13 through week 28 (months 4 through 6). It is also a time when the fetus is growing rapidly.  Your body goes through many changes during pregnancy. The changes vary from woman to woman.  Avoid all smoking, herbs, alcohol, and unprescribed drugs. These chemicals affect the formation and growth your baby.  Do not use any tobacco products, such as cigarettes, chewing tobacco, and e-cigarettes. If you need help quitting, ask your health care provider.  Contact your health care provider if you have any questions. Keep all prenatal visits as told by your health care provider. This is important. This information is not intended to replace advice given to you by your health care provider. Make sure you discuss any questions you have with your health care provider. Document Released: 02/17/2001 Document Revised: 08/01/2015 Document Reviewed: 04/26/2012 Elsevier Interactive Patient Education  2017 [employer].   Breastfeeding Deciding to breastfeed is one of the best choices you can make for you and your baby. A change in hormones during pregnancy causes your breast tissue to grow and increases the number and size of your milk ducts. These hormones also allow proteins, sugars, and fats from your blood supply to make breast milk in your milk-producing glands. Hormones prevent breast milk from being released before your baby is born as well as prompt milk flow after birth. Once breastfeeding has begun, thoughts of your baby, as well as his or her sucking or crying, can stimulate the release of milk from your milk-producing glands. Benefits of breastfeeding For Your Baby  Your first milk (colostrum) helps your baby's digestive  system function better.  There are antibodies in your milk that help your baby fight off infections.  Your baby has a lower incidence of asthma, allergies, and sudden infant death syndrome.  The nutrients in breast milk are better for your baby than infant formulas and are designed uniquely for your baby's needs.  Breast milk improves your baby's brain development.  Your baby is less likely to develop other conditions, such as childhood obesity, asthma, or type 2 diabetes mellitus. For You  Breastfeeding helps to create a very special bond between you and your baby.  Breastfeeding is convenient. Breast milk is always available at the correct temperature and costs nothing.  Breastfeeding helps to burn calories and helps you lose the weight gained during pregnancy.  Breastfeeding makes your uterus contract to its prepregnancy size faster and slows bleeding (lochia) after you give birth.  Breastfeeding helps to lower your risk of developing type 2 diabetes mellitus, osteoporosis, and breast or ovarian cancer later in life. Signs that your baby is hungry Early Signs of Hunger  Increased alertness or activity.  Stretching.  Movement of the head from side to side.  Movement of the head and opening of the mouth when the corner of the mouth or cheek is stroked (rooting).  Increased sucking sounds, smacking lips, cooing, sighing, or squeaking.  Hand-to-mouth movements.  Increased sucking of fingers or hands. Late Signs of Hunger  Fussing.  Intermittent crying. Extreme Signs of Hunger  Signs of extreme hunger will require calming and consoling before your baby will be able to breastfeed successfully. Do not wait for the following signs of extreme hunger to occur before you initiate breastfeeding:  Restlessness.  A loud, strong cry.  Screaming. Breastfeeding basics  Breastfeeding Initiation  Find a comfortable place to sit or lie down, with your neck and back well  supported.  Place a pillow or rolled up blanket under your baby to bring him or her to the level of your breast (if you are seated). Nursing pillows are specially designed to help support your arms and your baby while you breastfeed.  Make sure that your baby's abdomen is facing your abdomen.  Gently massage your breast. With your fingertips, massage from your  chest wall toward your nipple in a circular motion. This encourages milk flow. You may need to continue this action during the feeding if your milk flows slowly.  Support your breast with 4 fingers underneath and your thumb above your nipple. Make sure your fingers are well away from your nipple and your baby's mouth.  Stroke your baby's lips gently with your finger or nipple.  When your baby's mouth is open wide enough, quickly bring your baby to your breast, placing your entire nipple and as much of the colored area around your nipple (areola) as possible into your baby's mouth.  More areola should be visible above your baby's upper lip than below the lower lip.  Your baby's tongue should be between his or her lower gum and your breast.  Ensure that your baby's mouth is correctly positioned around your nipple (latched). Your baby's lips should create a seal on your breast and be turned out (everted).  It is common for your baby to suck about 2-3 minutes in order to start the flow of breast milk. Latching  Teaching your baby how to latch on to your breast properly is very important. An improper latch can cause nipple pain and decreased milk supply for you and poor weight gain in your baby. Also, if your baby is not latched onto your nipple properly, he or she may swallow some air during feeding. This can make your baby fussy. Burping your baby when you switch breasts during the feeding can help to get rid of the air. However, teaching your baby to latch on properly is still the best way to prevent fussiness from swallowing air while  breastfeeding. Signs that your baby has successfully latched on to your nipple:  Silent tugging or silent sucking, without causing you pain.  Swallowing heard between every 3-4 sucks.  Muscle movement above and in front of his or her ears while sucking. Signs that your baby has not successfully latched on to nipple:  Sucking sounds or smacking sounds from your baby while breastfeeding.  Nipple pain. If you think your baby has not latched on correctly, slip your finger into the corner of your baby's mouth to break the suction and place it between your baby's gums. Attempt breastfeeding initiation again. Signs of Successful Breastfeeding  Signs from your baby:  A gradual decrease in the number of sucks or complete cessation of sucking.  Falling asleep.  Relaxation of his or her body.  Retention of a small amount of milk in his or her mouth.  Letting go of your breast by himself or herself. Signs from you:  Breasts that have increased in firmness, weight, and size 1-3 hours after feeding.  Breasts that are softer immediately after breastfeeding.  Increased milk volume, as well as a change in milk consistency and color by the fifth day of breastfeeding.  Nipples that are not sore, cracked, or bleeding. Signs That Your Pecola LeisureBaby is Getting Enough Milk  Wetting at least 1-2 diapers during the first 24 hours after birth.  Wetting at least 5-6 diapers every 24 hours for the first week after birth. The urine should be clear or pale yellow by 5 days after birth.  Wetting 6-8 diapers every 24 hours as your baby continues to grow and develop.  At least 3 stools in a 24-hour period by age 61 days. The stool should be soft and yellow.  At least 3 stools in a 24-hour period by age 81 days. The stool should be seedy  and yellow.  No loss of weight greater than 10% of birth weight during the first 47 days of age.  Average weight gain of 4-7 ounces (113-198 g) per week after age 820  days.  Consistent daily weight gain by age 82 days, without weight loss after the age of 2 weeks. After a feeding, your baby may spit up a small amount. This is common. Breastfeeding frequency and duration Frequent feeding will help you make more milk and can prevent sore nipples and breast engorgement. Breastfeed when you feel the need to reduce the fullness of your breasts or when your baby shows signs of hunger. This is called "breastfeeding on demand." Avoid introducing a pacifier to your baby while you are working to establish breastfeeding (the first 4-6 weeks after your baby is born). After this time you may choose to use a pacifier. Research has shown that pacifier use during the first year of a baby's life decreases the risk of sudden infant death syndrome (SIDS). Allow your baby to feed on each breast as long as he or she wants. Breastfeed until your baby is finished feeding. When your baby unlatches or falls asleep while feeding from the first breast, offer the second breast. Because newborns are often sleepy in the first few weeks of life, you may need to awaken your baby to get him or her to feed. Breastfeeding times will vary from baby to baby. However, the following rules can serve as a guide to help you ensure that your baby is properly fed:  Newborns (babies 62 weeks of age or younger) may breastfeed every 1-3 hours.  Newborns should not go longer than 3 hours during the day or 5 hours during the night without breastfeeding.  You should breastfeed your baby a minimum of 8 times in a 24-hour period until you begin to introduce solid foods to your baby at around 59 months of age. Breast milk pumping Pumping and storing breast milk allows you to ensure that your baby is exclusively fed your breast milk, even at times when you are unable to breastfeed. This is especially important if you are going back to work while you are still breastfeeding or when you are not able to be present during  feedings. Your lactation consultant can give you guidelines on how long it is safe to store breast milk. A breast pump is a machine that allows you to pump milk from your breast into a sterile bottle. The pumped breast milk can then be stored in a refrigerator or freezer. Some breast pumps are operated by hand, while others use electricity. Ask your lactation consultant which type will work best for you. Breast pumps can be purchased, but some hospitals and breastfeeding support groups lease breast pumps on a monthly basis. A lactation consultant can teach you how to hand express breast milk, if you prefer not to use a pump. Caring for your breasts while you breastfeed Nipples can become dry, cracked, and sore while breastfeeding. The following recommendations can help keep your breasts moisturized and healthy:  Avoid using soap on your nipples.  Wear a supportive bra. Although not required, special nursing bras and tank tops are designed to allow access to your breasts for breastfeeding without taking off your entire bra or top. Avoid wearing underwire-style bras or extremely tight bras.  Air dry your nipples for 3-21minutes after each feeding.  Use only cotton bra pads to absorb leaked breast milk. Leaking of breast milk between feedings is normal.  Use lanolin on your nipples after breastfeeding. Lanolin helps to maintain your skin's normal moisture barrier. If you use pure lanolin, you do not need to wash it off before feeding your baby again. Pure lanolin is not toxic to your baby. You may also hand express a few drops of breast milk and gently massage that milk into your nipples and allow the milk to air dry. In the first few weeks after giving birth, some women experience extremely full breasts (engorgement). Engorgement can make your breasts feel heavy, warm, and tender to the touch. Engorgement peaks within 3-5 days after you give birth. The following recommendations can help ease  engorgement:  Completely empty your breasts while breastfeeding or pumping. You may want to start by applying warm, moist heat (in the shower or with warm water-soaked hand towels) just before feeding or pumping. This increases circulation and helps the milk flow. If your baby does not completely empty your breasts while breastfeeding, pump any extra milk after he or she is finished.  Wear a snug bra (nursing or regular) or tank top for 1-2 days to signal your body to slightly decrease milk production.  Apply ice packs to your breasts, unless this is too uncomfortable for you.  Make sure that your baby is latched on and positioned properly while breastfeeding. If engorgement persists after 48 hours of following these recommendations, contact your health care provider or a Advertising copywriter. Overall health care recommendations while breastfeeding  Eat healthy foods. Alternate between meals and snacks, eating 3 of each per day. Because what you eat affects your breast milk, some of the foods may make your baby more irritable than usual. Avoid eating these foods if you are sure that they are negatively affecting your baby.  Drink milk, fruit juice, and water to satisfy your thirst (about 10 glasses a day).  Rest often, relax, and continue to take your prenatal vitamins to prevent fatigue, stress, and anemia.  Continue breast self-awareness checks.  Avoid chewing and smoking tobacco. Chemicals from cigarettes that pass into breast milk and exposure to secondhand smoke may harm your baby.  Avoid alcohol and drug use, including marijuana. Some medicines that may be harmful to your baby can pass through breast milk. It is important to ask your health care provider before taking any medicine, including all over-the-counter and prescription medicine as well as vitamin and herbal supplements. It is possible to become pregnant while breastfeeding. If birth control is desired, ask your health care  provider about options that will be safe for your baby. Contact a health care provider if:  You feel like you want to stop breastfeeding or have become frustrated with breastfeeding.  You have painful breasts or nipples.  Your nipples are cracked or bleeding.  Your breasts are red, tender, or warm.  You have a swollen area on either breast.  You have a fever or chills.  You have nausea or vomiting.  You have drainage other than breast milk from your nipples.  Your breasts do not become full before feedings by the fifth day after you give birth.  You feel sad and depressed.  Your baby is too sleepy to eat well.  Your baby is having trouble sleeping.  Your baby is wetting less than 3 diapers in a 24-hour period.  Your baby has less than 3 stools in a 24-hour period.  Your baby's skin or the white part of his or her eyes becomes yellow.  Your baby is not gaining weight  by 59 days of age. Get help right away if:  Your baby is overly tired (lethargic) and does not want to wake up and feed.  Your baby develops an unexplained fever. This information is not intended to replace advice given to you by your health care provider. Make sure you discuss any questions you have with your health care provider. Document Released: 02/23/2005 Document Revised: 08/07/2015 Document Reviewed: 08/17/2012 Elsevier Interactive Patient Education  2017 [employer].

## 2016-03-23 NOTE — Progress Notes (Signed)
   PRENATAL VISIT NOTE  Subjective:  Jill Orozco is a 26 y.o. G1P0 at 5536w2d being seen today for ongoing prenatal care.  She is currently monitored for the following issues for this high-risk pregnancy and has Chronic hypertension during pregnancy, antepartum; Supervision of high risk pregnancy, antepartum, second trimester; Obesity in pregnancy; and BMI 50.0-59.9, adult (HCC) on her problem list.  Patient reports no complaints.  Contractions: Not present. Vag. Bleeding: None.  Movement: Present. Denies leaking of fluid.   The following portions of the patient's history were reviewed and updated as appropriate: allergies, current medications, past family history, past medical history, past social history, past surgical history and problem list. Problem list updated.  Objective:   Vitals:   03/23/16 1404  BP: 132/79  Pulse: (!) 112  Weight: 295 lb (133.8 kg)    Fetal Status: Fetal Heart Rate (bpm): 152 Fundal Height: 28 cm Movement: Present     General:  Alert, oriented and cooperative. Patient is in no acute distress.  Skin: Skin is warm and dry. No rash noted.   Cardiovascular: Normal heart rate noted  Respiratory: Normal respiratory effort, no problems with respiration noted  Abdomen: Soft, gravid, appropriate for gestational age. Pain/Pressure: Absent     Pelvic:  Cervical exam deferred        Extremities: Normal range of motion.  Edema: None  Mental Status: Normal mood and affect. Normal behavior. Normal judgment and thought content.   Assessment and Plan:  Pregnancy: G1P0 at 7036w2d  1. Chronic hypertension during pregnancy, antepartum BP good on Labetalol and continue Baby ASA Discussed risks of Pre-eclampsia  2. Supervision of high risk pregnancy, antepartum, second trimester 28 wk labs next--come fasting Re-visited flu shot--still considering.  Preterm labor symptoms and general obstetric precautions including but not limited to vaginal bleeding, contractions,  leaking of fluid and fetal movement were reviewed in detail with the patient. Please refer to After Visit Summary for other counseling recommendations.  Return in 2 weeks (on 04/06/2016) for 28 wk labs.   Reva Boresanya S Alli Jasmer, MD

## 2016-04-06 ENCOUNTER — Ambulatory Visit (INDEPENDENT_AMBULATORY_CARE_PROVIDER_SITE_OTHER): Payer: Medicaid Other | Admitting: Family Medicine

## 2016-04-06 VITALS — BP 125/75 | HR 102 | Wt 297.7 lb

## 2016-04-06 DIAGNOSIS — O10913 Unspecified pre-existing hypertension complicating pregnancy, third trimester: Secondary | ICD-10-CM | POA: Diagnosis not present

## 2016-04-06 DIAGNOSIS — O0992 Supervision of high risk pregnancy, unspecified, second trimester: Secondary | ICD-10-CM

## 2016-04-06 DIAGNOSIS — O0993 Supervision of high risk pregnancy, unspecified, third trimester: Secondary | ICD-10-CM

## 2016-04-06 DIAGNOSIS — Z23 Encounter for immunization: Secondary | ICD-10-CM

## 2016-04-06 DIAGNOSIS — O10919 Unspecified pre-existing hypertension complicating pregnancy, unspecified trimester: Secondary | ICD-10-CM

## 2016-04-06 LAB — CBC
HEMATOCRIT: 31.9 % — AB (ref 35.0–45.0)
Hemoglobin: 10.6 g/dL — ABNORMAL LOW (ref 11.7–15.5)
MCH: 28.9 pg (ref 27.0–33.0)
MCHC: 33.2 g/dL (ref 32.0–36.0)
MCV: 86.9 fL (ref 80.0–100.0)
MPV: 11.5 fL (ref 7.5–12.5)
Platelets: 157 10*3/uL (ref 140–400)
RBC: 3.67 MIL/uL — ABNORMAL LOW (ref 3.80–5.10)
RDW: 16.4 % — AB (ref 11.0–15.0)
WBC: 4.9 10*3/uL (ref 3.8–10.8)

## 2016-04-06 LAB — POCT URINALYSIS DIP (DEVICE)
BILIRUBIN URINE: NEGATIVE
Glucose, UA: NEGATIVE mg/dL
HGB URINE DIPSTICK: NEGATIVE
Ketones, ur: NEGATIVE mg/dL
Leukocytes, UA: NEGATIVE
NITRITE: NEGATIVE
PH: 7 (ref 5.0–8.0)
Protein, ur: NEGATIVE mg/dL
SPECIFIC GRAVITY, URINE: 1.01 (ref 1.005–1.030)
UROBILINOGEN UA: 0.2 mg/dL (ref 0.0–1.0)

## 2016-04-06 LAB — 2HR GTT W 1 HR, CARPENTER, 75 G
GLUCOSE, 2 HR, GEST: 94 mg/dL (ref <153)
GLUCOSE, FASTING, GEST: 86 mg/dL (ref 65–91)
Glucose, 1 Hr, Gest: 101 mg/dL (ref <180)

## 2016-04-06 MED ORDER — FAMOTIDINE 20 MG PO TABS: 20.0000 mg | ORAL_TABLET | Freq: Two times a day (BID) | ORAL | 3 refills | Status: DC

## 2016-04-06 NOTE — Progress Notes (Signed)
   PRENATAL VISIT NOTE  Subjective:  Jill Orozco is a 26 y.o. G1P0 at 12867w2d being seen today for ongoing prenatal care.  She is currently monitored for the following issues for this high-risk pregnancy and has Chronic hypertension during pregnancy, antepartum; Supervision of high risk pregnancy, antepartum, second trimester; Obesity in pregnancy; and BMI 50.0-59.9, adult (HCC) on her problem list.  Patient reports heartburn.  Contractions: Not present. Vag. Bleeding: None.  Movement: Present. Denies leaking of fluid.   The following portions of the patient's history were reviewed and updated as appropriate: allergies, current medications, past family history, past medical history, past social history, past surgical history and problem list. Problem list updated.  Objective:   Vitals:   04/06/16 0915  BP: 125/75  Pulse: (!) 102  Weight: 297 lb 11.2 oz (135 kg)    Fetal Status: Fetal Heart Rate (bpm): 154   Movement: Present     General:  Alert, oriented and cooperative. Patient is in no acute distress.  Skin: Skin is warm and dry. No rash noted.   Cardiovascular: Normal heart rate noted  Respiratory: Normal respiratory effort, no problems with respiration noted  Abdomen: Soft, gravid, appropriate for gestational age. Pain/Pressure: Absent     Pelvic:  Cervical exam deferred        Extremities: Normal range of motion.  Edema: None  Mental Status: Normal mood and affect. Normal behavior. Normal judgment and thought content.   Assessment and Plan:  Pregnancy: G1P0 at 2817w2d  1. Supervision of high risk pregnancy in third trimester FHT and FH normal> Pepcid for heartburn. - 2Hr GTT w/ 1 Hr Carpenter 75 g - HIV antibody (with reflex) - RPR - CBC  2. Need for Tdap vaccination - Tdap vaccine greater than or equal to 7yo IM  3. Chronic hypertension during pregnancy, antepartum EFW 60% - next US 2/9    Preterm labor symptoms and general obstetric precautions including but  not limited to vaginal bleeding, contractions, leaking of fluid and fetal movement were reviewed in detail with the patient. Please refer to After Visit Summary for other counseling recommendations.  No Follow-up on file.   Levie HeritageJacob J Stinson, DO

## 2016-04-06 NOTE — Progress Notes (Signed)
28 week labs, tdap today 

## 2016-04-07 LAB — HIV ANTIBODY (ROUTINE TESTING W REFLEX): HIV 1&2 Ab, 4th Generation: NONREACTIVE

## 2016-04-07 LAB — RPR

## 2016-04-17 ENCOUNTER — Other Ambulatory Visit (HOSPITAL_COMMUNITY): Payer: Self-pay | Admitting: Obstetrics and Gynecology

## 2016-04-17 ENCOUNTER — Ambulatory Visit (HOSPITAL_COMMUNITY)
Admission: RE | Admit: 2016-04-17 | Discharge: 2016-04-17 | Disposition: A | Discharge status: Home / Self Care | Payer: Medicaid Other | Source: Ambulatory Visit | Attending: Obstetrics and Gynecology | Admitting: Obstetrics and Gynecology

## 2016-04-17 ENCOUNTER — Encounter (HOSPITAL_COMMUNITY): Payer: Self-pay

## 2016-04-17 DIAGNOSIS — O99213 Obesity complicating pregnancy, third trimester: Secondary | ICD-10-CM

## 2016-04-17 DIAGNOSIS — O10919 Unspecified pre-existing hypertension complicating pregnancy, unspecified trimester: Secondary | ICD-10-CM

## 2016-04-17 DIAGNOSIS — O99212 Obesity complicating pregnancy, second trimester: Secondary | ICD-10-CM | POA: Diagnosis not present

## 2016-04-17 DIAGNOSIS — O10913 Unspecified pre-existing hypertension complicating pregnancy, third trimester: Secondary | ICD-10-CM | POA: Insufficient documentation

## 2016-04-17 DIAGNOSIS — Z3A29 29 weeks gestation of pregnancy: Secondary | ICD-10-CM

## 2016-04-17 DIAGNOSIS — Z362 Encounter for other antenatal screening follow-up: Secondary | ICD-10-CM | POA: Diagnosis present

## 2016-04-17 DIAGNOSIS — O0992 Supervision of high risk pregnancy, unspecified, second trimester: Secondary | ICD-10-CM

## 2016-04-17 DIAGNOSIS — Z6841 Body Mass Index (BMI) 40.0 and over, adult: Secondary | ICD-10-CM | POA: Insufficient documentation

## 2016-04-20 ENCOUNTER — Ambulatory Visit (INDEPENDENT_AMBULATORY_CARE_PROVIDER_SITE_OTHER): Payer: Medicaid Other | Admitting: Obstetrics and Gynecology

## 2016-04-20 VITALS — BP 149/85 | HR 80 | Wt 298.0 lb

## 2016-04-20 DIAGNOSIS — O0992 Supervision of high risk pregnancy, unspecified, second trimester: Secondary | ICD-10-CM

## 2016-04-20 DIAGNOSIS — O10913 Unspecified pre-existing hypertension complicating pregnancy, third trimester: Secondary | ICD-10-CM

## 2016-04-20 DIAGNOSIS — O10919 Unspecified pre-existing hypertension complicating pregnancy, unspecified trimester: Secondary | ICD-10-CM

## 2016-04-20 LAB — POCT URINALYSIS DIP (DEVICE)
BILIRUBIN URINE: NEGATIVE
Glucose, UA: NEGATIVE mg/dL
Hgb urine dipstick: NEGATIVE
Ketones, ur: NEGATIVE mg/dL
LEUKOCYTES UA: NEGATIVE
NITRITE: NEGATIVE
Protein, ur: NEGATIVE mg/dL
Specific Gravity, Urine: 1.01 (ref 1.005–1.030)
Urobilinogen, UA: 0.2 mg/dL (ref 0.0–1.0)
pH: 7 (ref 5.0–8.0)

## 2016-04-20 NOTE — Progress Notes (Signed)
   PRENATAL VISIT NOTE  Subjective:  Jill Orozco is a 26 y.o. G1P0 at 7261w2d being seen today for ongoing prenatal care.  She is currently monitored for the following issues for this high-risk pregnancy and has Chronic hypertension during pregnancy, antepartum; Supervision of high risk pregnancy, antepartum, second trimester; Obesity in pregnancy; and BMI 50.0-59.9, adult (HCC) on her problem list.  Patient reports no complaints. Patient systolic BP mildly elevated today to 143, continue to monitor closely. Repeat BP ordered for Wed.  Contractions: Not present. Vag. Bleeding: None.  Movement: Present. Denies leaking of fluid.   The following portions of the patient's history were reviewed and updated as appropriate: allergies, current medications, past family history, past medical history, past social history, past surgical history and problem list. Problem list updated.  Objective:   Vitals:   04/20/16 1016  BP: (!) 149/85  Pulse: 80  Weight: 298 lb (135.2 kg)    Fetal Status: Fetal Heart Rate (bpm): 150   Movement: Present     General:  Alert, oriented and cooperative. Patient is in no acute distress.  Skin: Skin is warm and dry. No rash noted.   Cardiovascular: Normal heart rate noted  Respiratory: Normal respiratory effort, no problems with respiration noted  Abdomen: Soft, gravid, appropriate for gestational age. Pain/Pressure: Absent     Pelvic:  Cervical exam deferred        Extremities: Normal range of motion.  Edema: None  Mental Status: Normal mood and affect. Normal behavior. Normal judgment and thought content.   Assessment and Plan:  Pregnancy: G1P0 at 6361w2d  1. Chronic hypertension during pregnancy, antepartum She does have mildly elevated BP today. Reports she has been taking her medicine BID as prescribed. UA is negative for protein today.  -have patient return Wed. For nurse visit BP check. If remains elevated would increase labetolol -Plan to start twice  weekly testing with next visit at 32 wks. -growth ultrasound normal 2/9 repeat ordered 3/9.  2. Supervision of high risk pregnancy, antepartum, second trimester Up to date on care.  Term labor symptoms and general obstetric precautions including but not limited to vaginal bleeding, contractions, leaking of fluid and fetal movement were reviewed in detail with the patient. Please refer to After Visit Summary for other counseling recommendations.  Return in about 2 weeks (around 05/04/2016) for NST w/ provider start twice weekly testing.   Lorne SkeensNicholas Michael Schenk, MD

## 2016-04-20 NOTE — Patient Instructions (Signed)
Third Trimester of Pregnancy The third trimester is from week 29 through week 40 (months 7 through 9). The third trimester is a time when the unborn baby (fetus) is growing rapidly. At the end of the ninth month, the fetus is about 20 inches in length and weighs 6-10 pounds. Body changes during your third trimester Your body goes through many changes during pregnancy. The changes vary from woman to woman. During the third trimester:  Your weight will continue to increase. You can expect to gain 25-35 pounds (11-16 kg) by the end of the pregnancy.  You may begin to get stretch marks on your hips, abdomen, and breasts.  You may urinate more often because the fetus is moving lower into your pelvis and pressing on your bladder.  You may develop or continue to have heartburn. This is caused by increased hormones that slow down muscles in the digestive tract.  You may develop or continue to have constipation because increased hormones slow digestion and cause the muscles that push waste through your intestines to relax.  You may develop hemorrhoids. These are swollen veins (varicose veins) in the rectum that can itch or be painful.  You may develop swollen, bulging veins (varicose veins) in your legs.  You may have increased body aches in the pelvis, back, or thighs. This is due to weight gain and increased hormones that are relaxing your joints.  You may have changes in your hair. These can include thickening of your hair, rapid growth, and changes in texture. Some women also have hair loss during or after pregnancy, or hair that feels dry or thin. Your hair will most likely return to normal after your baby is born.  Your breasts will continue to grow and they will continue to become tender. A yellow fluid (colostrum) may leak from your breasts. This is the first milk you are producing for your baby.  Your belly button may stick out.  You may notice more swelling in your hands, face, or  ankles.  You may have increased tingling or numbness in your hands, arms, and legs. The skin on your belly may also feel numb.  You may feel short of breath because of your expanding uterus.  You may have more problems sleeping. This can be caused by the size of your belly, increased need to urinate, and an increase in your body's metabolism.  You may notice the fetus "dropping," or moving lower in your abdomen.  You may have increased vaginal discharge.  Your cervix becomes thin and soft (effaced) near your due date. What to expect at prenatal visits You will have prenatal exams every 2 weeks until week 36. Then you will have weekly prenatal exams. During a routine prenatal visit:  You will be weighed to make sure you and the fetus are growing normally.  Your blood pressure will be taken.  Your abdomen will be measured to track your baby's growth.  The fetal heartbeat will be listened to.  Any test results from the previous visit will be discussed.  You may have a cervical check near your due date to see if you have effaced. At around 36 weeks, your health care provider will check your cervix. At the same time, your health care provider will also perform a test on the secretions of the vaginal tissue. This test is to determine if a type of bacteria, Group B streptococcus, is present. Your health care provider will explain this further. Your health care provider may ask you:    What your birth plan is.  How you are feeling.  If you are feeling the baby move.  If you have had any abnormal symptoms, such as leaking fluid, bleeding, severe headaches, or abdominal cramping.  If you are using any tobacco products, including cigarettes, chewing tobacco, and electronic cigarettes.  If you have any questions. Other tests or screenings that may be performed during your third trimester include:  Blood tests that check for low iron levels (anemia).  Fetal testing to check the health,  activity level, and growth of the fetus. Testing is done if you have certain medical conditions or if there are problems during the pregnancy.  Nonstress test (NST). This test checks the health of your baby to make sure there are no signs of problems, such as the baby not getting enough oxygen. During this test, a belt is placed around your belly. The baby is made to move, and its heart rate is monitored during movement. What is false labor? False labor is a condition in which you feel small, irregular tightenings of the muscles in the womb (contractions) that eventually go away. These are called Braxton Hicks contractions. Contractions may last for hours, days, or even weeks before true labor sets in. If contractions come at regular intervals, become more frequent, increase in intensity, or become painful, you should see your health care provider. What are the signs of labor?  Abdominal cramps.  Regular contractions that start at 10 minutes apart and become stronger and more frequent with time.  Contractions that start on the top of the uterus and spread down to the lower abdomen and back.  Increased pelvic pressure and dull back pain.  A watery or bloody mucus discharge that comes from the vagina.  Leaking of amniotic fluid. This is also known as your "water breaking." It could be a slow trickle or a gush. Let your doctor know if it has a color or strange odor. If you have any of these signs, call your health care provider right away, even if it is before your due date. Follow these instructions at home: Eating and drinking  Continue to eat regular, healthy meals.  Do not eat:  Raw meat or meat spreads.  Unpasteurized milk or cheese.  Unpasteurized juice.  Store-made salad.  Refrigerated smoked seafood.  Hot dogs or deli meat, unless they are piping hot.  More than 6 ounces of albacore tuna a week.  Shark, swordfish, king mackerel, or tile fish.  Store-made salads.  Raw  sprouts, such as mung bean or alfalfa sprouts.  Take prenatal vitamins as told by your health care provider.  Take 1000 mg of calcium daily as told by your health care provider.  If you develop constipation:  Take over-the-counter or prescription medicines.  Drink enough fluid to keep your urine clear or pale yellow.  Eat foods that are high in fiber, such as fresh fruits and vegetables, whole grains, and beans.  Limit foods that are high in fat and processed sugars, such as fried and sweet foods. Activity  Exercise only as directed by your health care provider. Healthy pregnant women should aim for 2 hours and 30 minutes of moderate exercise per week. If you experience any pain or discomfort while exercising, stop.  Avoid heavy lifting.  Do not exercise in extreme heat or humidity, or at high altitudes.  Wear low-heel, comfortable shoes.  Practice good posture.  Do not travel far distances unless it is absolutely necessary and only with the approval   of your health care provider.  Wear your seat belt at all times while in a car, on a bus, or on a plane.  Take frequent breaks and rest with your legs elevated if you have leg cramps or low back pain.  Do not use hot tubs, steam rooms, or saunas.  You may continue to have sex unless your health care provider tells you otherwise. Lifestyle  Do not use any products that contain nicotine or tobacco, such as cigarettes and e-cigarettes. If you need help quitting, ask your health care provider.  Do not drink alcohol.  Do not use any medicinal herbs or unprescribed drugs. These chemicals affect the formation and growth of the baby.  If you develop varicose veins:  Wear support pantyhose or compression stockings as told by your healthcare provider.  Elevate your feet for 15 minutes, 3-4 times a day.  Wear a supportive maternity bra to help with breast tenderness. General instructions  Take over-the-counter and prescription  medicines only as told by your health care provider. There are medicines that are either safe or unsafe to take during pregnancy.  Take warm sitz baths to soothe any pain or discomfort caused by hemorrhoids. Use hemorrhoid cream or witch hazel if your health care provider approves.  Avoid cat litter boxes and soil used by cats. These carry germs that can cause birth defects in the baby. If you have a cat, ask someone to clean the litter box for you.  To prepare for the arrival of your baby:  Take prenatal classes to understand, practice, and ask questions about the labor and delivery.  Make a trial run to the hospital.  Visit the hospital and tour the maternity area.  Arrange for maternity or paternity leave through employers.  Arrange for family and friends to take care of pets while you are in the hospital.  Purchase a rear-facing car seat and make sure you know how to install it in your car.  Pack your hospital bag.  Prepare the baby's nursery. Make sure to remove all pillows and stuffed animals from the baby's crib to prevent suffocation.  Visit your dentist if you have not gone during your pregnancy. Use a soft toothbrush to brush your teeth and be gentle when you floss.  Keep all prenatal follow-up visits as told by your health care provider. This is important. Contact a health care provider if:  You are unsure if you are in labor or if your water has broken.  You become dizzy.  You have mild pelvic cramps, pelvic pressure, or nagging pain in your abdominal area.  You have lower back pain.  You have persistent nausea, vomiting, or diarrhea.  You have an unusual or bad smelling vaginal discharge.  You have pain when you urinate. Get help right away if:  You have a fever.  You are leaking fluid from your vagina.  You have spotting or bleeding from your vagina.  You have severe abdominal pain or cramping.  You have rapid weight loss or weight gain.  You have  shortness of breath with chest pain.  You notice sudden or extreme swelling of your face, hands, ankles, feet, or legs.  Your baby makes fewer than 10 movements in 2 hours.  You have severe headaches that do not go away with medicine.  You have vision changes. Summary  The third trimester is from week 29 through week 40, months 7 through 9. The third trimester is a time when the unborn baby (fetus)   is growing rapidly.  During the third trimester, your discomfort may increase as you and your baby continue to gain weight. You may have abdominal, leg, and back pain, sleeping problems, and an increased need to urinate.  During the third trimester your breasts will keep growing and they will continue to become tender. A yellow fluid (colostrum) may leak from your breasts. This is the first milk you are producing for your baby.  False labor is a condition in which you feel small, irregular tightenings of the muscles in the womb (contractions) that eventually go away. These are called Braxton Hicks contractions. Contractions may last for hours, days, or even weeks before true labor sets in.  Signs of labor can include: abdominal cramps; regular contractions that start at 10 minutes apart and become stronger and more frequent with time; watery or bloody mucus discharge that comes from the vagina; increased pelvic pressure and dull back pain; and leaking of amniotic fluid. This information is not intended to replace advice given to you by your health care provider. Make sure you discuss any questions you have with your health care provider. Document Released: 02/17/2001 Document Revised: 08/01/2015 Document Reviewed: 04/26/2012 Elsevier Interactive Patient Education  2017 [employer]  

## 2016-04-21 ENCOUNTER — Other Ambulatory Visit (HOSPITAL_COMMUNITY): Payer: Self-pay | Admitting: *Deleted

## 2016-04-21 DIAGNOSIS — O10919 Unspecified pre-existing hypertension complicating pregnancy, unspecified trimester: Secondary | ICD-10-CM

## 2016-04-22 ENCOUNTER — Ambulatory Visit: Payer: Medicaid Other | Admitting: *Deleted

## 2016-04-22 VITALS — BP 125/76 | HR 101 | Wt 298.1 lb

## 2016-04-22 DIAGNOSIS — O10919 Unspecified pre-existing hypertension complicating pregnancy, unspecified trimester: Secondary | ICD-10-CM

## 2016-04-22 NOTE — Progress Notes (Signed)
Pt denies H/A or visual disturbances. Medication reconciliation completed. Pt advised to return to hospital for sx of pre-eclampsia or decreased fetal movement. Pt voiced understanding.

## 2016-04-30 ENCOUNTER — Encounter: Payer: Self-pay | Admitting: Obstetrics and Gynecology

## 2016-05-04 ENCOUNTER — Ambulatory Visit (INDEPENDENT_AMBULATORY_CARE_PROVIDER_SITE_OTHER): Payer: Medicaid Other | Admitting: Obstetrics & Gynecology

## 2016-05-04 VITALS — BP 123/71 | HR 98 | Wt 304.0 lb

## 2016-05-04 DIAGNOSIS — O10913 Unspecified pre-existing hypertension complicating pregnancy, third trimester: Secondary | ICD-10-CM

## 2016-05-04 DIAGNOSIS — E669 Obesity, unspecified: Secondary | ICD-10-CM

## 2016-05-04 DIAGNOSIS — O99213 Obesity complicating pregnancy, third trimester: Secondary | ICD-10-CM

## 2016-05-04 DIAGNOSIS — O0992 Supervision of high risk pregnancy, unspecified, second trimester: Secondary | ICD-10-CM

## 2016-05-04 DIAGNOSIS — O10919 Unspecified pre-existing hypertension complicating pregnancy, unspecified trimester: Secondary | ICD-10-CM

## 2016-05-04 DIAGNOSIS — O0993 Supervision of high risk pregnancy, unspecified, third trimester: Secondary | ICD-10-CM

## 2016-05-04 DIAGNOSIS — O99210 Obesity complicating pregnancy, unspecified trimester: Secondary | ICD-10-CM

## 2016-05-04 NOTE — Progress Notes (Signed)
US for growth scheduled 3/9, BPP added

## 2016-05-04 NOTE — Progress Notes (Signed)
   PRENATAL VISIT NOTE  Subjective:  Jill Orozco is a 26 y.o. G1P0 at 7177w2d being seen today for ongoing prenatal care.  She is currently monitored for the following issues for this high-risk pregnancy and has Chronic hypertension during pregnancy, antepartum; Supervision of high risk pregnancy, antepartum, second trimester; Obesity in pregnancy; and BMI 50.0-59.9, adult (HCC) on her problem list.  Patient reports no complaints.  Contractions: Not present. Vag. Bleeding: None.  Movement: Present. Denies leaking of fluid.   The following portions of the patient's history were reviewed and updated as appropriate: allergies, current medications, past family history, past medical history, past social history, past surgical history and problem list. Problem list updated.  Objective:   Vitals:   05/04/16 0912  BP: 123/71  Pulse: 98  Weight: (!) 304 lb (137.9 kg)    Fetal Status: Fetal Heart Rate (bpm): NST   Movement: Present     General:  Alert, oriented and cooperative. Patient is in no acute distress.  Skin: Skin is warm and dry. No rash noted.   Cardiovascular: Normal heart rate noted  Respiratory: Normal respiratory effort, no problems with respiration noted  Abdomen: Soft, gravid, appropriate for gestational age. Pain/Pressure: Present     Pelvic:  Cervical exam deferred        Extremities: Normal range of motion.  Edema: None  Mental Status: Normal mood and affect. Normal behavior. Normal judgment and thought content.   Assessment and Plan:  Pregnancy: G1P0 at 3577w2d  1. Chronic hypertension during pregnancy, antepartum  - US MFM FETAL BPP WO NON STRESS; Future - Fetal nonstress test  2. Supervision of high risk pregnancy in third trimester   3. Supervision of high risk pregnancy, antepartum, second trimester   4. Obesity in pregnancy   Preterm labor symptoms and general obstetric precautions including but not limited to vaginal bleeding, contractions, leaking  of fluid and fetal movement were reviewed in detail with the patient. Please refer to After Visit Summary for other counseling recommendations.  Return in about 7 days (around 05/11/2016) for Ob fu and NST.   Allie BossierMyra C Tykwon Fera, MD

## 2016-05-07 ENCOUNTER — Ambulatory Visit (INDEPENDENT_AMBULATORY_CARE_PROVIDER_SITE_OTHER): Payer: Medicaid Other | Admitting: Obstetrics & Gynecology

## 2016-05-07 ENCOUNTER — Ambulatory Visit: Payer: Self-pay

## 2016-05-07 VITALS — BP 118/74 | HR 112

## 2016-05-07 DIAGNOSIS — O10913 Unspecified pre-existing hypertension complicating pregnancy, third trimester: Secondary | ICD-10-CM

## 2016-05-07 DIAGNOSIS — Z3689 Encounter for other specified antenatal screening: Secondary | ICD-10-CM | POA: Diagnosis not present

## 2016-05-07 DIAGNOSIS — O10919 Unspecified pre-existing hypertension complicating pregnancy, unspecified trimester: Secondary | ICD-10-CM

## 2016-05-07 NOTE — Progress Notes (Signed)
Pt informed that the ultrasound is considered a limited OB ultrasound and is not intended to be a complete ultrasound exam.  Patient also informed that the ultrasound is not being completed with the intent of assessing for fetal or placental anomalies or any pelvic abnormalities.  Explained that the purpose of today's ultrasound is to assess for presentation and amniotic fluid volume.  Patient acknowledges the purpose of the exam and the limitations of the study.    

## 2016-05-11 ENCOUNTER — Ambulatory Visit (INDEPENDENT_AMBULATORY_CARE_PROVIDER_SITE_OTHER): Payer: Medicaid Other | Admitting: Advanced Practice Midwife

## 2016-05-11 ENCOUNTER — Other Ambulatory Visit: Payer: Medicaid Other | Admitting: Advanced Practice Midwife

## 2016-05-11 VITALS — BP 144/82 | HR 111 | Wt 304.0 lb

## 2016-05-11 DIAGNOSIS — O10919 Unspecified pre-existing hypertension complicating pregnancy, unspecified trimester: Secondary | ICD-10-CM

## 2016-05-11 DIAGNOSIS — O10913 Unspecified pre-existing hypertension complicating pregnancy, third trimester: Secondary | ICD-10-CM | POA: Diagnosis not present

## 2016-05-11 DIAGNOSIS — O0993 Supervision of high risk pregnancy, unspecified, third trimester: Secondary | ICD-10-CM

## 2016-05-11 LAB — POCT URINALYSIS DIP (DEVICE)
Glucose, UA: NEGATIVE mg/dL
HGB URINE DIPSTICK: NEGATIVE
Ketones, ur: NEGATIVE mg/dL
NITRITE: NEGATIVE
PH: 6 (ref 5.0–8.0)
Protein, ur: 30 mg/dL — AB
Specific Gravity, Urine: >=1.03 (ref 1.005–1.030)
UROBILINOGEN UA: 0.2 mg/dL (ref 0.0–1.0)

## 2016-05-11 NOTE — Patient Instructions (Signed)
Hypertension During Pregnancy °Hypertension, commonly called high blood pressure, is when the force of blood pumping through your arteries is too strong. Arteries are blood vessels that carry blood from the heart throughout the body. Hypertension during pregnancy can cause problems for you and your baby. Your baby may be born early (prematurely) or may not weigh as much as he or she should at birth. Very bad cases of hypertension during pregnancy can be life-threatening. °Different types of hypertension can occur during pregnancy. These include: °· Chronic hypertension. This happens when: °¨ You have hypertension before pregnancy and it continues during pregnancy. °¨ You develop hypertension before you are [redacted] weeks pregnant, and it continues during pregnancy. °· Gestational hypertension. This is hypertension that develops after the 20th week of pregnancy. °· Preeclampsia, also called toxemia of pregnancy. This is a very serious type of hypertension that develops only during pregnancy. It affects the whole body, and it can be very dangerous for you and your baby. °Gestational hypertension and preeclampsia usually go away within 6 weeks after your baby is born. Women who have hypertension during pregnancy have a greater chance of developing hypertension later in life or during future pregnancies. °What are the causes? °The exact cause of hypertension is not known. °What increases the risk? °There are certain factors that make it more likely for you to develop hypertension during pregnancy. These include: °· Having hypertension during a previous pregnancy or prior to pregnancy. °· Being overweight. °· Being older than age 40. °· Being pregnant for the first time or being pregnant with more than one baby. °· Becoming pregnant using fertilization methods such as IVF (in vitro fertilization). °· Having diabetes, kidney problems, or systemic lupus erythematosus. °· Having a family history of hypertension. °What are the  signs or symptoms? °Chronic hypertension and gestational hypertension rarely cause symptoms. Preeclampsia causes symptoms, which may include: °· Increased protein in your urine. Your health care provider will check for this at every visit before you give birth (prenatal visit). °· Severe headaches. °· Sudden weight gain. °· Swelling of the hands, face, legs, and feet. °· Nausea and vomiting. °· Vision problems, such as blurred or double vision. °· Numbness in the face, arms, legs, and feet. °· Dizziness. °· Slurred speech. °· Sensitivity to bright lights. °· Abdominal pain. °· Convulsions. °How is this diagnosed? °You may be diagnosed with hypertension during a routine prenatal exam. At each prenatal visit, you may: °· Have a urine test to check for high amounts of protein in your urine. °· Have your blood pressure checked. A blood pressure reading is recorded as two numbers, such as "120 over 80" (or 120/80). The first ("top") number is called the systolic pressure. It is a measure of the pressure in your arteries when your heart beats. The second ("bottom") number is called the diastolic pressure. It is a measure of the pressure in your arteries as your heart relaxes between beats. Blood pressure is measured in a unit called mm Hg. A normal blood pressure reading is: °¨ Systolic: below 120. °¨ Diastolic: below 80. °The type of hypertension that you are diagnosed with depends on your test results and when your symptoms developed. °· Chronic hypertension is usually diagnosed before 20 weeks of pregnancy. °· Gestational hypertension is usually diagnosed after 20 weeks of pregnancy. °· Hypertension with high amounts of protein in the urine is diagnosed as preeclampsia. °· Blood pressure measurements that stay above 160 systolic, or above 110 diastolic, are signs of severe preeclampsia. °  How is this treated? °Treatment for hypertension during pregnancy varies depending on the type of hypertension you have and how  serious it is. °· If you take medicines called ACE inhibitors to treat chronic hypertension, you may need to switch medicines. ACE inhibitors should not be taken during pregnancy. °· If you have gestational hypertension, you may need to take blood pressure medicine. °· If you are at risk for preeclampsia, your health care provider may recommend that you take a low-dose aspirin every day to prevent high blood pressure during your pregnancy. °· If you have severe preeclampsia, you may need to be hospitalized so you and your baby can be monitored closely. You may also need to take medicine (magnesium sulfate) to prevent seizures and to lower blood pressure. This medicine may be given as an injection or through an IV tube. °· In some cases, if your condition gets worse, you may need to deliver your baby early. °Follow these instructions at home: °Eating and drinking °· Drink enough fluid to keep your urine clear or pale yellow. °· Eat a healthy diet that is low in salt (sodium). Do not add salt to your food. Check food labels to see how much sodium a food or beverage contains. °Lifestyle °· Do not use any products that contain nicotine or tobacco, such as cigarettes and e-cigarettes. If you need help quitting, ask your health care provider. °· Do not use alcohol. °· Avoid caffeine. °· Avoid stress as much as possible. Rest and get plenty of sleep. °General instructions °· Take over-the-counter and prescription medicines only as told by your health care provider. °· While lying down, lie on your left side. This keeps pressure off your baby. °· While sitting or lying down, raise (elevate) your feet. Try putting some pillows under your lower legs. °· Exercise regularly. Ask your health care provider what kinds of exercise are best for you. °· Keep all prenatal and follow-up visits as told by your health care provider. This is important. °Contact a health care provider if: °· You have symptoms that your health care provider  told you may require more treatment or monitoring, such as: °¨ Fever. °¨ Vomiting. °¨ Headache. °Get help right away if: °· You have severe abdominal pain or vomiting that does not get better with treatment. °· You suddenly develop swelling in your hands, ankles, or face. °· You gain 4 lbs (1.8 kg) or more in 1 week. °· You develop vaginal bleeding, or you have blood in your urine. °· You do not feel your baby moving as much as usual. °· You have blurred or double vision. °· You have muscle twitching or sudden tightening (spasms). °· You have shortness of breath. °· Your lips or fingernails turn blue. °This information is not intended to replace advice given to you by your health care provider. Make sure you discuss any questions you have with your health care provider. °Document Released: 11/11/2010 Document Revised: 09/13/2015 Document Reviewed: 08/09/2015 °Elsevier Interactive Patient Education © 2017 [employer] Inc. ° °

## 2016-05-11 NOTE — Progress Notes (Signed)
   PRENATAL VISIT NOTE  Subjective:  Jill Orozco is a 26 y.o. G1P0 at 6629w2d being seen today for ongoing prenatal care.  She is currently monitored for the following issues for this high-risk pregnancy and has Chronic hypertension during pregnancy, antepartum; Supervision of high risk pregnancy, antepartum, second trimester; Obesity in pregnancy; and BMI 50.0-59.9, adult (HCC) on her problem list.  Patient reports no complaints.  Contractions: Not present. Vag. Bleeding: None.  Movement: Present. Denies leaking of fluid.   The following portions of the patient's history were reviewed and updated as appropriate: allergies, current medications, past family history, past medical history, past social history, past surgical history and problem list. Problem list updated.  Objective:   Vitals:   05/11/16 0900  BP: (!) 144/82  Pulse: (!) 111  Weight: (!) 304 lb (137.9 kg)    Fetal Status: Fetal Heart Rate (bpm): NST   Movement: Present    NST reactive General:  Alert, oriented and cooperative. Patient is in no acute distress.  Skin: Skin is warm and dry. No rash noted.   Cardiovascular: Normal heart rate noted  Respiratory: Normal respiratory effort, no problems with respiration noted  Abdomen: Soft, gravid, appropriate for gestational age. Pain/Pressure: Absent     Pelvic:  Cervical exam deferred        Extremities: Normal range of motion.  Edema: None  Mental Status: Normal mood and affect. Normal behavior. Normal judgment and thought content.   Assessment and Plan:  Pregnancy: G1P0 at 4529w2d  1. Chronic hypertension during pregnancy, antepartum  - Fetal nonstress test - POCT urinalysis dip (device)  2. Supervision of high risk pregnancy in third trimester  - Prenatal Vit-Fe Fumarate-FA (MULTIVITAMIN-PRENATAL) 27-0.8 MG TABS tablet; Take 1 tablet by mouth daily at 12 noon.  Preterm labor symptoms and general obstetric precautions including but not limited to vaginal  bleeding, contractions, leaking of fluid and fetal movement were reviewed in detail with the patient. Pre-E precautions Please refer to After Visit Summary for other counseling recommendations.  Return in about 7 days (around 05/18/2016) for Ob fu and NST;  3/16 NST/AFI.   Dorathy KinsmanVirginia Melana Hingle, CNM

## 2016-05-11 NOTE — Addendum Note (Signed)
Addended by: Dorathy KinsmanSMITH, Daymon Hora on: 05/11/2016 10:35 AM   Modules accepted: Level of Service

## 2016-05-11 NOTE — Progress Notes (Signed)
NST reactive.

## 2016-05-15 ENCOUNTER — Encounter (HOSPITAL_COMMUNITY): Payer: Self-pay | Admitting: *Deleted

## 2016-05-15 ENCOUNTER — Inpatient Hospital Stay (HOSPITAL_COMMUNITY)
Admission: EM | Admit: 2016-05-15 | Discharge: 2016-05-15 | Disposition: A | Discharge status: Home / Self Care | Payer: Medicaid Other | Source: Ambulatory Visit | Attending: Obstetrics & Gynecology | Admitting: Obstetrics & Gynecology

## 2016-05-15 ENCOUNTER — Encounter (HOSPITAL_COMMUNITY): Payer: Self-pay

## 2016-05-15 ENCOUNTER — Ambulatory Visit (HOSPITAL_COMMUNITY)
Admission: RE | Admit: 2016-05-15 | Discharge: 2016-05-15 | Disposition: A | Discharge status: Home / Self Care | Payer: Medicaid Other | Source: Ambulatory Visit | Attending: Obstetrics and Gynecology | Admitting: Obstetrics and Gynecology

## 2016-05-15 ENCOUNTER — Inpatient Hospital Stay (HOSPITAL_COMMUNITY): Payer: Medicaid Other

## 2016-05-15 DIAGNOSIS — O99213 Obesity complicating pregnancy, third trimester: Secondary | ICD-10-CM | POA: Insufficient documentation

## 2016-05-15 DIAGNOSIS — Z3689 Encounter for other specified antenatal screening: Secondary | ICD-10-CM

## 2016-05-15 DIAGNOSIS — Z3A33 33 weeks gestation of pregnancy: Secondary | ICD-10-CM | POA: Diagnosis not present

## 2016-05-15 DIAGNOSIS — O0992 Supervision of high risk pregnancy, unspecified, second trimester: Secondary | ICD-10-CM

## 2016-05-15 DIAGNOSIS — O10919 Unspecified pre-existing hypertension complicating pregnancy, unspecified trimester: Secondary | ICD-10-CM | POA: Diagnosis not present

## 2016-05-15 DIAGNOSIS — Z8249 Family history of ischemic heart disease and other diseases of the circulatory system: Secondary | ICD-10-CM | POA: Insufficient documentation

## 2016-05-15 DIAGNOSIS — Z79899 Other long term (current) drug therapy: Secondary | ICD-10-CM | POA: Insufficient documentation

## 2016-05-15 DIAGNOSIS — O10013 Pre-existing essential hypertension complicating pregnancy, third trimester: Secondary | ICD-10-CM | POA: Insufficient documentation

## 2016-05-15 DIAGNOSIS — O10913 Unspecified pre-existing hypertension complicating pregnancy, third trimester: Secondary | ICD-10-CM | POA: Insufficient documentation

## 2016-05-15 DIAGNOSIS — Z6841 Body Mass Index (BMI) 40.0 and over, adult: Secondary | ICD-10-CM | POA: Diagnosis not present

## 2016-05-15 DIAGNOSIS — O368390 Maternal care for abnormalities of the fetal heart rate or rhythm, unspecified trimester, not applicable or unspecified: Secondary | ICD-10-CM

## 2016-05-15 DIAGNOSIS — Z87891 Personal history of nicotine dependence: Secondary | ICD-10-CM | POA: Insufficient documentation

## 2016-05-15 NOTE — Procedures (Signed)
Jill SimmeringLacresha Orozco 07/06/1990 [id number]  Fetus A Non-Stress Test Interpretation for 05/15/16  Indication: Decreased Fetal Movement  Fetal Heart Rate A Mode: External Baseline Rate (A): 140 bpm Variability: Moderate Accelerations: 15 x 15 Decelerations: None Multiple birth?: No  Uterine Activity Mode: Toco, Palpation Contraction Frequency (min): x1 Contraction Duration (sec): 50 Contraction Quality: Mild Resting Tone Palpated: Relaxed Resting Time: Adequate  Interpretation (Fetal Testing) Nonstress Test Interpretation: Reactive Overall Impression: Reassuring for gestational age Comments: Reviewed strip with Dr. Adonis HousekeeperJ. Orozco  Jill BogaJeffrey Morgan Orozco , MD, MS, [facility]

## 2016-05-15 NOTE — Discharge Instructions (Signed)
Fetal Movement Counts °Patient Name: ________________________________________________ Patient Due Date: ____________________ °What is a fetal movement count? °A fetal movement count is the number of times that you feel your baby move during a certain amount of time. This may also be called a fetal kick count. A fetal movement count is recommended for every pregnant woman. You may be asked to start counting fetal movements as early as week 28 of your pregnancy. °Pay attention to when your baby is most active. You may notice your baby's sleep and wake cycles. You may also notice things that make your baby move more. You should do a fetal movement count: °· When your baby is normally most active. °· At the same time each day. °A good time to count movements is while you are resting, after having something to eat and drink. °How do I count fetal movements? °1. Find a quiet, comfortable area. Sit, or lie down on your side. °2. Write down the date, the start time and stop time, and the number of movements that you felt between those two times. Take this information with you to your health care visits. °3. For 2 hours, count kicks, flutters, swishes, rolls, and jabs. You should feel at least 10 movements during 2 hours. °4. You may stop counting after you have felt 10 movements. °5. If you do not feel 10 movements in 2 hours, have something to eat and drink. Then, keep resting and counting for 1 hour. If you feel at least 4 movements during that hour, you may stop counting. °Contact a health care provider if: °· You feel fewer than 4 movements in 2 hours. °· Your baby is not moving like he or she usually does. °Date: ____________ Start time: ____________ Stop time: ____________ Movements: ____________ °Date: ____________ Start time: ____________ Stop time: ____________ Movements: ____________ °Date: ____________ Start time: ____________ Stop time: ____________ Movements: ____________ °Date: ____________ Start time:  ____________ Stop time: ____________ Movements: ____________ °Date: ____________ Start time: ____________ Stop time: ____________ Movements: ____________ °Date: ____________ Start time: ____________ Stop time: ____________ Movements: ____________ °Date: ____________ Start time: ____________ Stop time: ____________ Movements: ____________ °Date: ____________ Start time: ____________ Stop time: ____________ Movements: ____________ °Date: ____________ Start time: ____________ Stop time: ____________ Movements: ____________ °This information is not intended to replace advice given to you by your health care provider. Make sure you discuss any questions you have with your health care provider. °Document Released: 03/25/2006 Document Revised: 10/23/2015 Document Reviewed: 04/04/2015 °Elsevier Interactive Patient Education © 2017 [employer] °Hypertension During Pregnancy °Hypertension, commonly called high blood pressure, is when the force of blood pumping through your arteries is too strong. Arteries are blood vessels that carry blood from the heart throughout the body. Hypertension during pregnancy can cause problems for you and your baby. Your baby may be born early (prematurely) or may not weigh as much as he or she should at birth. Very bad cases of hypertension during pregnancy can be life-threatening. °Different types of hypertension can occur during pregnancy. These include: °· Chronic hypertension. This happens when: °¨ You have hypertension before pregnancy and it continues during pregnancy. °¨ You develop hypertension before you are [redacted] weeks pregnant, and it continues during pregnancy. °· Gestational hypertension. This is hypertension that develops after the 20th week of pregnancy. °· Preeclampsia, also called toxemia of pregnancy. This is a very serious type of hypertension that develops only during pregnancy. It affects the whole body, and it can be very dangerous for you and your baby. °Gestational    hypertension and preeclampsia usually go away within 6 weeks after your baby is born. Women who have hypertension during pregnancy have a greater chance of developing hypertension later in life or during future pregnancies. °What are the causes? °The exact cause of hypertension is not known. °What increases the risk? °There are certain factors that make it more likely for you to develop hypertension during pregnancy. These include: °· Having hypertension during a previous pregnancy or prior to pregnancy. °· Being overweight. °· Being older than age 40. °· Being pregnant for the first time or being pregnant with more than one baby. °· Becoming pregnant using fertilization methods such as IVF (in vitro fertilization). °· Having diabetes, kidney problems, or systemic lupus erythematosus. °· Having a family history of hypertension. °What are the signs or symptoms? °Chronic hypertension and gestational hypertension rarely cause symptoms. Preeclampsia causes symptoms, which may include: °· Increased protein in your urine. Your health care provider will check for this at every visit before you give birth (prenatal visit). °· Severe headaches. °· Sudden weight gain. °· Swelling of the hands, face, legs, and feet. °· Nausea and vomiting. °· Vision problems, such as blurred or double vision. °· Numbness in the face, arms, legs, and feet. °· Dizziness. °· Slurred speech. °· Sensitivity to bright lights. °· Abdominal pain. °· Convulsions. °How is this diagnosed? °You may be diagnosed with hypertension during a routine prenatal exam. At each prenatal visit, you may: °· Have a urine test to check for high amounts of protein in your urine. °· Have your blood pressure checked. A blood pressure reading is recorded as two numbers, such as "120 over 80" (or 120/80). The first ("top") number is called the systolic pressure. It is a measure of the pressure in your arteries when your heart beats. The second ("bottom") number is called  the diastolic pressure. It is a measure of the pressure in your arteries as your heart relaxes between beats. Blood pressure is measured in a unit called mm Hg. A normal blood pressure reading is: °¨ Systolic: below 120. °¨ Diastolic: below 80. °The type of hypertension that you are diagnosed with depends on your test results and when your symptoms developed. °· Chronic hypertension is usually diagnosed before 20 weeks of pregnancy. °· Gestational hypertension is usually diagnosed after 20 weeks of pregnancy. °· Hypertension with high amounts of protein in the urine is diagnosed as preeclampsia. °· Blood pressure measurements that stay above 160 systolic, or above 110 diastolic, are signs of severe preeclampsia. °How is this treated? °Treatment for hypertension during pregnancy varies depending on the type of hypertension you have and how serious it is. °· If you take medicines called ACE inhibitors to treat chronic hypertension, you may need to switch medicines. ACE inhibitors should not be taken during pregnancy. °· If you have gestational hypertension, you may need to take blood pressure medicine. °· If you are at risk for preeclampsia, your health care provider may recommend that you take a low-dose aspirin every day to prevent high blood pressure during your pregnancy. °· If you have severe preeclampsia, you may need to be hospitalized so you and your baby can be monitored closely. You may also need to take medicine (magnesium sulfate) to prevent seizures and to lower blood pressure. This medicine may be given as an injection or through an IV tube. °· In some cases, if your condition gets worse, you may need to deliver your baby early. °Follow these instructions at home: °Eating and drinking  °·   Drink enough fluid to keep your urine clear or pale yellow. °· Eat a healthy diet that is low in salt (sodium). Do not add salt to your food. Check food labels to see how much sodium a food or beverage  contains. °Lifestyle  °· Do not use any products that contain nicotine or tobacco, such as cigarettes and e-cigarettes. If you need help quitting, ask your health care provider. °· Do not use alcohol. °· Avoid caffeine. °· Avoid stress as much as possible. Rest and get plenty of sleep. °General instructions  °· Take over-the-counter and prescription medicines only as told by your health care provider. °· While lying down, lie on your left side. This keeps pressure off your baby. °· While sitting or lying down, raise (elevate) your feet. Try putting some pillows under your lower legs. °· Exercise regularly. Ask your health care provider what kinds of exercise are best for you. °· Keep all prenatal and follow-up visits as told by your health care provider. This is important. °Contact a health care provider if: °· You have symptoms that your health care provider told you may require more treatment or monitoring, such as: °¨ Fever. °¨ Vomiting. °¨ Headache. °Get help right away if: °· You have severe abdominal pain or vomiting that does not get better with treatment. °· You suddenly develop swelling in your hands, ankles, or face. °· You gain 4 lbs (1.8 kg) or more in 1 week. °· You develop vaginal bleeding, or you have blood in your urine. °· You do not feel your baby moving as much as usual. °· You have blurred or double vision. °· You have muscle twitching or sudden tightening (spasms). °· You have shortness of breath. °· Your lips or fingernails turn blue. °This information is not intended to replace advice given to you by your health care provider. Make sure you discuss any questions you have with your health care provider. °Document Released: 11/11/2010 Document Revised: 09/13/2015 Document Reviewed: 08/09/2015 °Elsevier Interactive Patient Education © 2017 [employer] Inc. ° °

## 2016-05-15 NOTE — MAU Provider Note (Signed)
History     CSN: [id number]  Arrival date and time: 05/15/16 4098   First Provider Initiated Contact with Patient 05/15/16 1808      Chief Complaint  Patient presents with  . Non-stress Test  . extended fetal monitoring & repeat BPP   HPI Patient Jill Orozco is a 26 year old G1P0 at 32 weeks and 6 days here after a 6/10 BPP at her MFM appointment. Patient had a reactive NST but -2 for tone and -2 for movement. She denies bleeding and leaking of fluid, and she endorses positive fetal movements.    OB History    Gravida Para Term Preterm AB Living   1         0   SAB TAB Ectopic Multiple Live Births                  Past Medical History:  Diagnosis Date  . Hypertension     Past Surgical History:  Procedure Laterality Date  . NO PAST SURGERIES      Family History  Problem Relation Age of Onset  . Hypertension Mother     Social History  Substance Use Topics  . Smoking status: Former Smoker    Types: Cigars  . Smokeless tobacco: Never Used  . Alcohol use Yes     Comment: occasionally before pregnancy    Allergies: No Known Allergies  Prescriptions Prior to Admission  Medication Sig Dispense Refill Last Dose  . aspirin 81 MG chewable tablet Chew 1 tablet (81 mg total) by mouth daily. 30 tablet 6 Past Week at Unknown time  . calcium carbonate (TUMS EX) 750 MG chewable tablet Chew 4-6 tablets by mouth daily as needed for heartburn.   05/15/2016 at Unknown time  . famotidine (PEPCID) 20 MG tablet Take 1 tablet (20 mg total) by mouth 2 (two) times daily. 60 tablet 3 05/15/2016 at Unknown time  . labetalol (NORMODYNE) 200 MG tablet Take 0.5 tablets (100 mg total) by mouth 2 (two) times daily. 60 tablet 3 05/15/2016 at 0900  . Prenatal Vit-Fe Fumarate-FA (MULTIVITAMIN-PRENATAL) 27-0.8 MG TABS tablet Take 1 tablet by mouth daily at 12 noon.   05/15/2016 at Unknown time    Review of Systems  Constitutional: Negative.   HENT: Negative.   Eyes: Negative.    Respiratory: Negative.   Cardiovascular: Negative.   Gastrointestinal: Negative.   Endocrine: Negative.   Genitourinary: Negative.   Musculoskeletal: Negative.   Neurological: Negative.    Physical Exam   Blood pressure 122/62, pulse 110, temperature 98.1 F (36.7 C), temperature source Oral, resp. rate 18, last menstrual period 09/21/2015.  Physical Exam  Constitutional: She is oriented to person, place, and time. She appears well-developed and well-nourished.  HENT:  Head: Normocephalic.  Neck: Normal range of motion.  Respiratory: Effort normal.  GI: Soft. Bowel sounds are normal.  Musculoskeletal: Normal range of motion.  Neurological: She is alert and oriented to person, place, and time.  Skin: Skin is warm and dry.    MAU Course  Procedures No results found for this or any previous visit (from the past 24 hour(s)).   MDM -NST prolonged monitoring for 4 hours: Reactive with 140 BPM moderate variability, present acel, neg dec, no contractions.  -BPP after four hours Assessment and Plan    Patient care relinquished to Judeth Horn, NP at 2235.   Jill Orozco 05/15/2016, 6:09 PM   -Category 1 tracing x 4 hours of monitoring -Repeat BPP 8/8 (10/10) -  Discussed monitoring & new BPP with Dr. Despina HiddenEure who agrees that patient can be discharged home  A: 1. NST (non-stress test) reactive on fetal surveillance   2. Non-reassuring electronic fetal monitoring tracing   3. Chronic hypertension affecting pregnancy    P: Discharge home Discussed reasons to return to MAU Keep follow up appointment with OB/PCP   Judeth HornErin Fatim Vanderschaaf, NP

## 2016-05-15 NOTE — ED Notes (Signed)
Report given to Ninfa MeekerJudy Lowe, RN.  [facility] reactive NST today per Dr. Marjo Bickerenney.  Pt walked and registered by RN to MAU.

## 2016-05-15 NOTE — MAU Note (Signed)
Pt sent from MFM with BPP 4/8 but had reactive NST.  Pt denies contractions, bleeding or LOF.

## 2016-05-18 ENCOUNTER — Ambulatory Visit (INDEPENDENT_AMBULATORY_CARE_PROVIDER_SITE_OTHER): Payer: Medicaid Other | Admitting: Obstetrics and Gynecology

## 2016-05-18 VITALS — BP 125/67 | HR 98 | Wt 301.7 lb

## 2016-05-18 DIAGNOSIS — O10913 Unspecified pre-existing hypertension complicating pregnancy, third trimester: Secondary | ICD-10-CM

## 2016-05-18 DIAGNOSIS — O0993 Supervision of high risk pregnancy, unspecified, third trimester: Secondary | ICD-10-CM

## 2016-05-18 DIAGNOSIS — O10919 Unspecified pre-existing hypertension complicating pregnancy, unspecified trimester: Secondary | ICD-10-CM

## 2016-05-18 LAB — POCT URINALYSIS DIP (DEVICE)
BILIRUBIN URINE: NEGATIVE
GLUCOSE, UA: NEGATIVE mg/dL
KETONES UR: NEGATIVE mg/dL
Leukocytes, UA: NEGATIVE
Nitrite: NEGATIVE
PROTEIN: NEGATIVE mg/dL
Specific Gravity, Urine: 1.01 (ref 1.005–1.030)
Urobilinogen, UA: 0.2 mg/dL (ref 0.0–1.0)
pH: 7 (ref 5.0–8.0)

## 2016-05-18 NOTE — Progress Notes (Signed)
Pt had MAU visit on 3/9 for prolonged EFM due to BPP 6/8 and NR NST. Repeat BPP same Jill Orozco was 8/8.

## 2016-05-18 NOTE — Progress Notes (Signed)
Subjective:  Jill SimmeringLacresha Selleck is a 26 y.o. G1P0 at 7734w2d being seen today for ongoing prenatal care.  She is currently monitored for the following issues for this high-risk pregnancy and has Chronic hypertension during pregnancy, antepartum; Supervision of high risk pregnancy, antepartum, second trimester; Obesity in pregnancy; and BMI 50.0-59.9, adult (HCC) on her problem list.  Patient reports no complaints.  Contractions: Not present. Vag. Bleeding: None.  Movement: Present. Denies leaking of fluid.   The following portions of the patient's history were reviewed and updated as appropriate: allergies, current medications, past family history, past medical history, past social history, past surgical history and problem list. Problem list updated.  Objective:   Vitals:   05/18/16 1002  BP: 125/67  Pulse: 98  Weight: (!) 301 lb 11.2 oz (136.9 kg)    Fetal Status: Fetal Heart Rate (bpm): NST   Movement: Present     General:  Alert, oriented and cooperative. Patient is in no acute distress.  Skin: Skin is warm and dry. No rash noted.   Cardiovascular: Normal heart rate noted  Respiratory: Normal respiratory effort, no problems with respiration noted  Abdomen: Soft, gravid, appropriate for gestational age. Pain/Pressure: Absent     Pelvic:  Cervical exam deferred        Extremities: Normal range of motion.  Edema: None  Mental Status: Normal mood and affect. Normal behavior. Normal judgment and thought content.   Urinalysis:      Assessment and Plan:  Pregnancy: G1P0 at 3234w2d  1. Chronic hypertension during pregnancy, antepartum Reactive NST today Continue with Labetalol and BASA Continue antenatal testing - Fetal nonstress test  2. Supervision of high risk pregnancy in third trimester stable  Preterm labor symptoms and general obstetric precautions including but not limited to vaginal bleeding, contractions, leaking of fluid and fetal movement were reviewed in detail with the  patient. Please refer to After Visit Summary for other counseling recommendations.  Return in about 1 week (around 05/25/2016) for Ob fu and NST, OB visit.   Hermina StaggersMichael L Sreya Froio, MD

## 2016-05-22 ENCOUNTER — Ambulatory Visit: Payer: Self-pay

## 2016-05-22 ENCOUNTER — Ambulatory Visit (INDEPENDENT_AMBULATORY_CARE_PROVIDER_SITE_OTHER): Payer: Medicaid Other | Admitting: *Deleted

## 2016-05-22 VITALS — BP 125/81 | HR 92

## 2016-05-22 DIAGNOSIS — O10919 Unspecified pre-existing hypertension complicating pregnancy, unspecified trimester: Secondary | ICD-10-CM

## 2016-05-22 DIAGNOSIS — O10913 Unspecified pre-existing hypertension complicating pregnancy, third trimester: Secondary | ICD-10-CM | POA: Diagnosis not present

## 2016-05-22 DIAGNOSIS — Z3689 Encounter for other specified antenatal screening: Secondary | ICD-10-CM

## 2016-05-22 NOTE — Progress Notes (Signed)
Pt informed that the ultrasound is considered a limited OB ultrasound and is not intended to be a complete ultrasound exam.  Patient also informed that the ultrasound is not being completed with the intent of assessing for fetal or placental anomalies or any pelvic abnormalities.  Explained that the purpose of today's ultrasound is to assess for presentation and amniotic fluid volume.  Patient acknowledges the purpose of the exam and the limitations of the study.    

## 2016-05-22 NOTE — Progress Notes (Signed)
NST reviewed and reactive.  

## 2016-05-25 ENCOUNTER — Ambulatory Visit (INDEPENDENT_AMBULATORY_CARE_PROVIDER_SITE_OTHER): Payer: Medicaid Other | Admitting: Obstetrics and Gynecology

## 2016-05-25 VITALS — BP 122/66 | HR 102 | Wt 305.5 lb

## 2016-05-25 DIAGNOSIS — O10919 Unspecified pre-existing hypertension complicating pregnancy, unspecified trimester: Secondary | ICD-10-CM

## 2016-05-25 DIAGNOSIS — O10913 Unspecified pre-existing hypertension complicating pregnancy, third trimester: Secondary | ICD-10-CM

## 2016-05-25 DIAGNOSIS — O0992 Supervision of high risk pregnancy, unspecified, second trimester: Secondary | ICD-10-CM

## 2016-05-25 NOTE — Progress Notes (Signed)
   PRENATAL VISIT NOTE  Subjective:  Jill SimmeringLacresha Ransford is a 26 y.o. G1P0 at 5521w2d being seen today for ongoing prenatal care.  She is currently monitored for the following issues for this high-risk pregnancy and has Chronic hypertension during pregnancy, antepartum; Supervision of high risk pregnancy, antepartum, second trimester; Obesity in pregnancy; and BMI 50.0-59.9, adult (HCC) on her problem list.  Patient reports Patietn reports she did fall on a few days ago onto her hands and knees. She did not come in to be evaluated. She did not hit abdomen. She reported some mild pelvic pressure and some limited cramping after falling which has improved. no vaginal bleeding. .  Contractions: Not present. Vag. Bleeding: None.  Movement: Present. Denies leaking of fluid.   The following portions of the patient's history were reviewed and updated as appropriate: allergies, current medications, past family history, past medical history, past social history, past surgical history and problem list. Problem list updated.  Objective:   Vitals:   05/25/16 0901  BP: 122/66  Pulse: (!) 102  Weight: (!) 305 lb 8 oz (138.6 kg)    Fetal Status: Fetal Heart Rate (bpm): NST   Movement: Present     General:  Alert, oriented and cooperative. Patient is in no acute distress.  Skin: Skin is warm and dry. No rash noted.   Cardiovascular: Normal heart rate noted  Respiratory: Normal respiratory effort, no problems with respiration noted  Abdomen: Soft, gravid, appropriate for gestational age. Pain/Pressure: Present     Pelvic:  Cervical exam deferred        Extremities: Normal range of motion.  Edema: Trace  Mental Status: Normal mood and affect. Normal behavior. Normal judgment and thought content.   Assessment and Plan:  Pregnancy: G1P0 at 5521w2d  1. Chronic hypertension during pregnancy, antepartum -NST today - 145/mod variability/reactive. No contractions.  -continue ASA until 36-37 wks -US last on  3-9  2. Supervision of high risk pregnancy, antepartum, second trimester -up to date 36 wks labs next week.   Preterm labor symptoms and general obstetric precautions including but not limited to vaginal bleeding, contractions, leaking of fluid and fetal movement were reviewed in detail with the patient. Please refer to After Visit Summary for other counseling recommendations.  Return in about 1 week (around 06/01/2016) for with provider and continue twice weekly NST.   Lorne SkeensNicholas Michael Joncarlo Friberg, MD

## 2016-05-25 NOTE — Patient Instructions (Signed)
Third Trimester of Pregnancy The third trimester is from week 28 through week 40 (months 7 through 9). The third trimester is a time when the unborn baby (fetus) is growing rapidly. At the end of the ninth month, the fetus is about 20 inches in length and weighs 6-10 pounds. Body changes during your third trimester Your body will continue to go through many changes during pregnancy. The changes vary from woman to woman. During the third trimester:  Your weight will continue to increase. You can expect to gain 25-35 pounds (11-16 kg) by the end of the pregnancy.  You may begin to get stretch marks on your hips, abdomen, and breasts.  You may urinate more often because the fetus is moving lower into your pelvis and pressing on your bladder.  You may develop or continue to have heartburn. This is caused by increased hormones that slow down muscles in the digestive tract.  You may develop or continue to have constipation because increased hormones slow digestion and cause the muscles that push waste through your intestines to relax.  You may develop hemorrhoids. These are swollen veins (varicose veins) in the rectum that can itch or be painful.  You may develop swollen, bulging veins (varicose veins) in your legs.  You may have increased body aches in the pelvis, back, or thighs. This is due to weight gain and increased hormones that are relaxing your joints.  You may have changes in your hair. These can include thickening of your hair, rapid growth, and changes in texture. Some women also have hair loss during or after pregnancy, or hair that feels dry or thin. Your hair will most likely return to normal after your baby is born.  Your breasts will continue to grow and they will continue to become tender. A yellow fluid (colostrum) may leak from your breasts. This is the first milk you are producing for your baby.  Your belly button may stick out.  You may notice more swelling in your hands,  face, or ankles.  You may have increased tingling or numbness in your hands, arms, and legs. The skin on your belly may also feel numb.  You may feel short of breath because of your expanding uterus.  You may have more problems sleeping. This can be caused by the size of your belly, increased need to urinate, and an increase in your body's metabolism.  You may notice the fetus "dropping," or moving lower in your abdomen (lightening).  You may have increased vaginal discharge.  You may notice your joints feel loose and you may have pain around your pelvic bone.  What to expect at prenatal visits You will have prenatal exams every 2 weeks until week 36. Then you will have weekly prenatal exams. During a routine prenatal visit:  You will be weighed to make sure you and the baby are growing normally.  Your blood pressure will be taken.  Your abdomen will be measured to track your baby's growth.  The fetal heartbeat will be listened to.  Any test results from the previous visit will be discussed.  You may have a cervical check near your due date to see if your cervix has softened or thinned (effaced).  You will be tested for Group B streptococcus. This happens between 35 and 37 weeks.  Your health care provider may ask you:  What your birth plan is.  How you are feeling.  If you are feeling the baby move.  If you have had   any abnormal symptoms, such as leaking fluid, bleeding, severe headaches, or abdominal cramping.  If you are using any tobacco products, including cigarettes, chewing tobacco, and electronic cigarettes.  If you have any questions.  Other tests or screenings that may be performed during your third trimester include:  Blood tests that check for low iron levels (anemia).  Fetal testing to check the health, activity level, and growth of the fetus. Testing is done if you have certain medical conditions or if there are problems during the  pregnancy.  Nonstress test (NST). This test checks the health of your baby to make sure there are no signs of problems, such as the baby not getting enough oxygen. During this test, a belt is placed around your belly. The baby is made to move, and its heart rate is monitored during movement.  What is false labor? False labor is a condition in which you feel small, irregular tightenings of the muscles in the womb (contractions) that usually go away with rest, changing position, or drinking water. These are called Braxton Hicks contractions. Contractions may last for hours, days, or even weeks before true labor sets in. If contractions come at regular intervals, become more frequent, increase in intensity, or become painful, you should see your health care provider. What are the signs of labor?  Abdominal cramps.  Regular contractions that start at 10 minutes apart and become stronger and more frequent with time.  Contractions that start on the top of the uterus and spread down to the lower abdomen and back.  Increased pelvic pressure and dull back pain.  A watery or bloody mucus discharge that comes from the vagina.  Leaking of amniotic fluid. This is also known as your "water breaking." It could be a slow trickle or a gush. Let your health care provider know if it has a color or strange odor. If you have any of these signs, call your health care provider right away, even if it is before your due date. Follow these instructions at home: Medicines  Follow your health care provider's instructions regarding medicine use. Specific medicines may be either safe or unsafe to take during pregnancy.  Take a prenatal vitamin that contains at least 600 micrograms (mcg) of folic acid.  If you develop constipation, try taking a stool softener if your health care provider approves. Eating and drinking  Eat a balanced diet that includes fresh fruits and vegetables, whole grains, good sources of protein  such as meat, eggs, or tofu, and low-fat dairy. Your health care provider will help you determine the amount of weight gain that is right for you.  Avoid raw meat and uncooked cheese. These carry germs that can cause birth defects in the baby.  If you have low calcium intake from food, talk to your health care provider about whether you should take a daily calcium supplement.  Eat four or five small meals rather than three large meals a day.  Limit foods that are high in fat and processed sugars, such as fried and sweet foods.  To prevent constipation: ? Drink enough fluid to keep your urine clear or pale yellow. ? Eat foods that are high in fiber, such as fresh fruits and vegetables, whole grains, and beans. Activity  Exercise only as directed by your health care provider. Most women can continue their usual exercise routine during pregnancy. Try to exercise for 30 minutes at least 5 days a week. Stop exercising if you experience uterine contractions.  Avoid heavy   lifting.  Do not exercise in extreme heat or humidity, or at high altitudes.  Wear low-heel, comfortable shoes.  Practice good posture.  You may continue to have sex unless your health care provider tells you otherwise. Relieving pain and discomfort  Take frequent breaks and rest with your legs elevated if you have leg cramps or low back pain.  Take warm sitz baths to soothe any pain or discomfort caused by hemorrhoids. Use hemorrhoid cream if your health care provider approves.  Wear a good support bra to prevent discomfort from breast tenderness.  If you develop varicose veins: ? Wear support pantyhose or compression stockings as told by your healthcare provider. ? Elevate your feet for 15 minutes, 3-4 times a day. Prenatal care  Write down your questions. Take them to your prenatal visits.  Keep all your prenatal visits as told by your health care provider. This is important. Safety  Wear your seat belt at  all times when driving.  Make a list of emergency phone numbers, including numbers for family, friends, the hospital, and police and fire departments. General instructions  Avoid cat litter boxes and soil used by cats. These carry germs that can cause birth defects in the baby. If you have a cat, ask someone to clean the litter box for you.  Do not travel far distances unless it is absolutely necessary and only with the approval of your health care provider.  Do not use hot tubs, steam rooms, or saunas.  Do not drink alcohol.  Do not use any products that contain nicotine or tobacco, such as cigarettes and e-cigarettes. If you need help quitting, ask your health care provider.  Do not use any medicinal herbs or unprescribed drugs. These chemicals affect the formation and growth of the baby.  Do not douche or use tampons or scented sanitary pads.  Do not cross your legs for long periods of time.  To prepare for the arrival of your baby: ? Take prenatal classes to understand, practice, and ask questions about labor and delivery. ? Make a trial run to the hospital. ? Visit the hospital and tour the maternity area. ? Arrange for maternity or paternity leave through employers. ? Arrange for family and friends to take care of pets while you are in the hospital. ? Purchase a rear-facing car seat and make sure you know how to install it in your car. ? Pack your hospital bag. ? Prepare the baby's nursery. Make sure to remove all pillows and stuffed animals from the baby's crib to prevent suffocation.  Visit your dentist if you have not gone during your pregnancy. Use a soft toothbrush to brush your teeth and be gentle when you floss. Contact a health care provider if:  You are unsure if you are in labor or if your water has broken.  You become dizzy.  You have mild pelvic cramps, pelvic pressure, or nagging pain in your abdominal area.  You have lower back pain.  You have persistent  nausea, vomiting, or diarrhea.  You have an unusual or bad smelling vaginal discharge.  You have pain when you urinate. Get help right away if:  Your water breaks before 37 weeks.  You have regular contractions less than 5 minutes apart before 37 weeks.  You have a fever.  You are leaking fluid from your vagina.  You have spotting or bleeding from your vagina.  You have severe abdominal pain or cramping.  You have rapid weight loss or weight gain.    You have shortness of breath with chest pain.  You notice sudden or extreme swelling of your face, hands, ankles, feet, or legs.  Your baby makes fewer than 10 movements in 2 hours.  You have severe headaches that do not go away when you take medicine.  You have vision changes. Summary  The third trimester is from week 28 through week 40, months 7 through 9. The third trimester is a time when the unborn baby (fetus) is growing rapidly.  During the third trimester, your discomfort may increase as you and your baby continue to gain weight. You may have abdominal, leg, and back pain, sleeping problems, and an increased need to urinate.  During the third trimester your breasts will keep growing and they will continue to become tender. A yellow fluid (colostrum) may leak from your breasts. This is the first milk you are producing for your baby.  False labor is a condition in which you feel small, irregular tightenings of the muscles in the womb (contractions) that eventually go away. These are called Braxton Hicks contractions. Contractions may last for hours, days, or even weeks before true labor sets in.  Signs of labor can include: abdominal cramps; regular contractions that start at 10 minutes apart and become stronger and more frequent with time; watery or bloody mucus discharge that comes from the vagina; increased pelvic pressure and dull back pain; and leaking of amniotic fluid. This information is not intended to replace advice  given to you by your health care provider. Make sure you discuss any questions you have with your health care provider. Document Released: 02/17/2001 Document Revised: 08/01/2015 Document Reviewed: 04/26/2012 Elsevier Interactive Patient Education  2017 [employer]  

## 2016-05-25 NOTE — Addendum Note (Signed)
Addended by: Jill SideAY, Ashlyn Cabler L on: 05/25/2016 10:26 AM   Modules accepted: Orders

## 2016-05-25 NOTE — Progress Notes (Signed)
States fell to her knees on 05/22/16- states caught herself and did not hit stomach on anything. Since then baby has been moving normally- has not had any bleeding. States since then has had increased pressure and pelvic pain, lower back pain.

## 2016-05-28 ENCOUNTER — Ambulatory Visit (INDEPENDENT_AMBULATORY_CARE_PROVIDER_SITE_OTHER): Payer: Medicaid Other | Admitting: *Deleted

## 2016-05-28 ENCOUNTER — Ambulatory Visit: Payer: Self-pay

## 2016-05-28 VITALS — BP 133/71 | HR 107

## 2016-05-28 DIAGNOSIS — O10913 Unspecified pre-existing hypertension complicating pregnancy, third trimester: Secondary | ICD-10-CM | POA: Diagnosis present

## 2016-05-28 DIAGNOSIS — Z3689 Encounter for other specified antenatal screening: Secondary | ICD-10-CM | POA: Diagnosis not present

## 2016-05-28 DIAGNOSIS — O10919 Unspecified pre-existing hypertension complicating pregnancy, unspecified trimester: Secondary | ICD-10-CM

## 2016-05-28 NOTE — Progress Notes (Signed)
NST performed today was reviewed and was found to be reactive.  AFI was also normal.  Continue recommended antenatal testing and prenatal care. 

## 2016-05-28 NOTE — Progress Notes (Signed)
Pt informed that the ultrasound is considered a limited OB ultrasound and is not intended to be a complete ultrasound exam.  Patient also informed that the ultrasound is not being completed with the intent of assessing for fetal or placental anomalies or any pelvic abnormalities.  Explained that the purpose of today's ultrasound is to assess for presentation and amniotic fluid volume.  Patient acknowledges the purpose of the exam and the limitations of the study.    

## 2016-06-01 ENCOUNTER — Ambulatory Visit (INDEPENDENT_AMBULATORY_CARE_PROVIDER_SITE_OTHER): Payer: Medicaid Other | Admitting: Obstetrics & Gynecology

## 2016-06-01 ENCOUNTER — Other Ambulatory Visit (HOSPITAL_COMMUNITY)
Admission: RE | Admit: 2016-06-01 | Discharge: 2016-06-01 | Disposition: A | Discharge status: Home / Self Care | Payer: Medicaid Other | Source: Ambulatory Visit | Attending: Obstetrics & Gynecology | Admitting: Obstetrics & Gynecology

## 2016-06-01 VITALS — BP 125/73 | HR 97 | Wt 307.8 lb

## 2016-06-01 DIAGNOSIS — Z3A36 36 weeks gestation of pregnancy: Secondary | ICD-10-CM | POA: Insufficient documentation

## 2016-06-01 DIAGNOSIS — O10913 Unspecified pre-existing hypertension complicating pregnancy, third trimester: Secondary | ICD-10-CM | POA: Diagnosis not present

## 2016-06-01 DIAGNOSIS — O10919 Unspecified pre-existing hypertension complicating pregnancy, unspecified trimester: Secondary | ICD-10-CM

## 2016-06-01 DIAGNOSIS — O0993 Supervision of high risk pregnancy, unspecified, third trimester: Secondary | ICD-10-CM | POA: Insufficient documentation

## 2016-06-01 LAB — POCT URINALYSIS DIP (DEVICE)
Glucose, UA: NEGATIVE mg/dL
Hgb urine dipstick: NEGATIVE
Ketones, ur: NEGATIVE mg/dL
Leukocytes, UA: NEGATIVE
NITRITE: NEGATIVE
Protein, ur: 30 mg/dL — AB
SPECIFIC GRAVITY, URINE: 1.025 (ref 1.005–1.030)
UROBILINOGEN UA: 1 mg/dL (ref 0.0–1.0)
pH: 6.5 (ref 5.0–8.0)

## 2016-06-01 NOTE — Progress Notes (Signed)
   PRENATAL VISIT NOTE  Subjective:  Jill Orozco is a 26 y.o. G1P0 at 7355w2d being seen today for ongoing prenatal care.  She is currently monitored for the following issues for this high-risk pregnancy and has Chronic hypertension during pregnancy, antepartum; Supervision of high risk pregnancy, antepartum, second trimester; Obesity in pregnancy; and BMI 50.0-59.9, adult (HCC) on her problem list.  Patient reports increased swelling of hands and feet. She is also having back pain and pelvic pain and pressure.  Contractions: Not present. Vag. Bleeding: None.  Movement: Present. Denies leaking of fluid.   The following portions of the patient's history were reviewed and updated as appropriate: allergies, current medications, past family history, past medical history, past social history, past surgical history and problem list. Problem list updated.  Objective:   Vitals:   06/01/16 0937  BP: 125/73  Pulse: 97  Weight: (!) 307 lb 12.8 oz (139.6 kg)    Fetal Status: Fetal Heart Rate (bpm): NST Fundal Height: 37 cm Movement: Present  Presentation: Vertex  General:  Alert, oriented and cooperative. Patient is in no acute distress.  Skin: Skin is warm and dry. No rash noted.   Cardiovascular: Normal heart rate noted  Respiratory: Normal respiratory effort, no problems with respiration noted  Abdomen: Soft, gravid, appropriate for gestational age. Pain/Pressure: Present     Pelvic:  Cervical exam performed Dilation: Closed Effacement (%): Thick Station: Ballotable  Extremities: Normal range of motion.  Edema: Trace  Mental Status: Normal mood and affect. Normal behavior. Normal judgment and thought content.   NST performed today was reviewed and was found to be reactive. Continue recommended antenatal testing and prenatal care.  Assessment and Plan:  Pregnancy: G1P0 at 7355w2d  1. Chronic hypertension during pregnancy, antepartum BP nml today.  Continue antenatal testing.   Preeclampsia precautions reviewed. - Fetal nonstress test  2. Supervision of high risk pregnancy in third trimester - [facility]lam[facility] - Strep Gp B NAA Reassured about symptoms.  Preterm labor symptoms and general obstetric precautions including but not limited to vaginal bleeding, contractions, leaking of fluid and fetal movement were reviewed in detail with the patient. Please refer to After Visit Summary for other counseling recommendations.  Return in about 7 days (around 06/08/2016) for Ob fu and NST.   Tereso NewcomerUgonna A Mazella Deen, MD

## 2016-06-01 NOTE — Patient Instructions (Signed)
Return to clinic for any scheduled appointments or obstetric concerns, or go to [facility] for evaluation  

## 2016-06-02 LAB — GC/CHLAMYDIA PROBE AMP (~~LOC~~) NOT AT ARMC
Chlamydia: NEGATIVE
NEISSERIA GONORRHEA: NEGATIVE

## 2016-06-03 LAB — STREP GP B NAA: Strep Gp B NAA: NEGATIVE

## 2016-06-04 ENCOUNTER — Ambulatory Visit: Payer: Self-pay

## 2016-06-04 ENCOUNTER — Ambulatory Visit (INDEPENDENT_AMBULATORY_CARE_PROVIDER_SITE_OTHER): Payer: Medicaid Other | Admitting: Obstetrics & Gynecology

## 2016-06-04 VITALS — BP 130/75 | HR 96

## 2016-06-04 DIAGNOSIS — O10919 Unspecified pre-existing hypertension complicating pregnancy, unspecified trimester: Secondary | ICD-10-CM

## 2016-06-04 DIAGNOSIS — O10913 Unspecified pre-existing hypertension complicating pregnancy, third trimester: Secondary | ICD-10-CM | POA: Diagnosis present

## 2016-06-04 NOTE — Progress Notes (Signed)
Pt informed that the ultrasound is considered a limited OB ultrasound and is not intended to be a complete ultrasound exam.  Patient also informed that the ultrasound is not being completed with the intent of assessing for fetal or placental anomalies or any pelvic abnormalities.  Explained that the purpose of today's ultrasound is to assess for presentation and amniotic fluid volume.  Patient acknowledges the purpose of the exam and the limitations of the study.    

## 2016-06-08 ENCOUNTER — Ambulatory Visit (INDEPENDENT_AMBULATORY_CARE_PROVIDER_SITE_OTHER): Payer: Medicaid Other | Admitting: Obstetrics & Gynecology

## 2016-06-08 VITALS — BP 136/77 | HR 90 | Wt 315.2 lb

## 2016-06-08 DIAGNOSIS — O10919 Unspecified pre-existing hypertension complicating pregnancy, unspecified trimester: Secondary | ICD-10-CM

## 2016-06-08 DIAGNOSIS — O10913 Unspecified pre-existing hypertension complicating pregnancy, third trimester: Secondary | ICD-10-CM

## 2016-06-08 DIAGNOSIS — O0993 Supervision of high risk pregnancy, unspecified, third trimester: Secondary | ICD-10-CM

## 2016-06-08 DIAGNOSIS — O0992 Supervision of high risk pregnancy, unspecified, second trimester: Secondary | ICD-10-CM

## 2016-06-08 NOTE — Progress Notes (Signed)
Pt reports having numbness and tingling in both hands - especially first thing in the morning. Wrist splints advised. [place] for growth scheduled 4/12.  IOL scheduled 4/14 @ midnight.     PRENATAL VISIT NOTE  Subjective:  Jill Orozco is a 26 y.o. G1P0 at [redacted]w[redacted]d being seen today for ongoing prenatal care.  She is currently monitored for the following issues for this high-risk pregnancy and has Chronic hypertension during pregnancy, antepartum; Supervision of high risk pregnancy, antepartum, second trimester; Obesity in pregnancy; and BMI 50.0-59.9, adult (HCC) on her problem list.  Patient reports hand numbnes at night.  Contractions: Not present. Vag. Bleeding: None.  Movement: Present. Denies leaking of fluid.   The following portions of the patient's history were reviewed and updated as appropriate: allergies, current medications, past family history, past medical history, past social history, past surgical history and problem list. Problem list updated.  Objective:   Vitals:   06/08/16 0759  BP: 136/77  Pulse: 90  Weight: (!) 315 lb 3.2 oz (143 kg)    Fetal Status: Fetal Heart Rate (bpm): NST-R   Movement: Present     General:  Alert, oriented and cooperative. Patient is in no acute distress.  Skin: Skin is warm and dry. No rash noted.   Cardiovascular: Normal heart rate noted  Respiratory: Normal respiratory effort, no problems with respiration noted  Abdomen: Soft, gravid, appropriate for gestational age. Pain/Pressure: Present     Pelvic:  Cervical exam deferred        Extremities: Normal range of motion.  Edema: Trace  Mental Status: Normal mood and affect. Normal behavior. Normal judgment and thought content.   Assessment and Plan:  Pregnancy: G1P0 at [redacted]w[redacted]d  1. Chronic hypertension during pregnancy, antepartum - Fetal nonstress test - [place] MFM OB FOLLOW UP; Future  2. Supervision of high risk pregnancy in third trimester -Night splints for carpel tunnel  symptoms   Term labor symptoms and general obstetric precautions including but not limited to vaginal bleeding, contractions, leaking of fluid and fetal movement were reviewed in detail with the patient. Please refer to After Visit Summary for other counseling recommendations.  Return in about 7 days (around 06/15/2016) for Ob fu and NST;  4/12  NST - has [place] @ 0830.   Lesly Dukes, MD

## 2016-06-11 ENCOUNTER — Ambulatory Visit: Payer: Self-pay

## 2016-06-11 ENCOUNTER — Ambulatory Visit (INDEPENDENT_AMBULATORY_CARE_PROVIDER_SITE_OTHER): Payer: Medicaid Other | Admitting: *Deleted

## 2016-06-11 VITALS — BP 138/84 | HR 95

## 2016-06-11 DIAGNOSIS — O10913 Unspecified pre-existing hypertension complicating pregnancy, third trimester: Secondary | ICD-10-CM | POA: Diagnosis present

## 2016-06-11 DIAGNOSIS — O10919 Unspecified pre-existing hypertension complicating pregnancy, unspecified trimester: Secondary | ICD-10-CM

## 2016-06-11 NOTE — Addendum Note (Signed)
Addended by: Willodean Rosenthal on: 06/11/2016 06:07 PM   Modules accepted: Level of Service

## 2016-06-11 NOTE — Progress Notes (Signed)
Pt informed that the ultrasound is considered a limited OB ultrasound and is not intended to be a complete ultrasound exam.  Patient also informed that the ultrasound is not being completed with the intent of assessing for fetal or placental anomalies or any pelvic abnormalities.  Explained that the purpose of today's ultrasound is to assess for presentation and amniotic fluid volume.  Patient acknowledges the purpose of the exam and the limitations of the study.    [place] for growth scheduled 4/12.  IOL scheduled on 4/14

## 2016-06-11 NOTE — Progress Notes (Signed)
NST reviewed and reactive.  Jahmiya Guidotti L. Harraway-Smith, M.D., FACOG    

## 2016-06-15 ENCOUNTER — Ambulatory Visit (INDEPENDENT_AMBULATORY_CARE_PROVIDER_SITE_OTHER): Payer: Medicaid Other | Admitting: Obstetrics & Gynecology

## 2016-06-15 VITALS — BP 135/77 | HR 99 | Wt 309.4 lb

## 2016-06-15 DIAGNOSIS — O10913 Unspecified pre-existing hypertension complicating pregnancy, third trimester: Secondary | ICD-10-CM | POA: Diagnosis present

## 2016-06-15 DIAGNOSIS — O10919 Unspecified pre-existing hypertension complicating pregnancy, unspecified trimester: Secondary | ICD-10-CM

## 2016-06-15 DIAGNOSIS — O0992 Supervision of high risk pregnancy, unspecified, second trimester: Secondary | ICD-10-CM

## 2016-06-15 LAB — POCT URINALYSIS DIP (DEVICE)
GLUCOSE, UA: NEGATIVE mg/dL
HGB URINE DIPSTICK: NEGATIVE
Ketones, ur: NEGATIVE mg/dL
LEUKOCYTES UA: NEGATIVE
NITRITE: NEGATIVE
Protein, ur: 30 mg/dL — AB
Specific Gravity, Urine: 1.025 (ref 1.005–1.030)
UROBILINOGEN UA: 1 mg/dL (ref 0.0–1.0)
pH: 6.5 (ref 5.0–8.0)

## 2016-06-15 NOTE — Progress Notes (Signed)
   PRENATAL VISIT NOTE  Subjective:  Jill Orozco is a 26 y.o. G1P0 at [redacted]w[redacted]d being seen today for ongoing prenatal care.  She is currently monitored for the following issues for this high-risk pregnancy and has Chronic hypertension during pregnancy, antepartum; Supervision of high risk pregnancy, antepartum, second trimester; Obesity in pregnancy; and BMI 50.0-59.9, adult (HCC) on her problem list.  Patient reports no complaints.  Contractions: Not present. Vag. Bleeding: None.  Movement: Present. Denies leaking of fluid.   The following portions of the patient's history were reviewed and updated as appropriate: allergies, current medications, past family history, past medical history, past social history, past surgical history and problem list. Problem list updated.  Objective:   Vitals:   06/15/16 0912  BP: 135/77  Pulse: 99  Weight: (!) 309 lb 6.4 oz (140.3 kg)    Fetal Status: Fetal Heart Rate (bpm): NST   Movement: Present     General:  Alert, oriented and cooperative. Patient is in no acute distress.  Skin: Skin is warm and dry. No rash noted.   Cardiovascular: Normal heart rate noted  Respiratory: Normal respiratory effort, no problems with respiration noted  Abdomen: Soft, gravid, appropriate for gestational age. Pain/Pressure: Present     Pelvic:  Cervical exam deferred        Extremities: Normal range of motion.  Edema: Trace  Mental Status: Normal mood and affect. Normal behavior. Normal judgment and thought content.   Assessment and Plan:  Pregnancy: G1P0 at [redacted]w[redacted]d  1. Chronic hypertension during pregnancy, antepartum -BP well controlled - Fetal nonstress test--REACTIVE Cont 2x week testing Induction Saturday Growth [place] this week.  Term labor symptoms and general obstetric precautions including but not limited to vaginal bleeding, contractions, leaking of fluid and fetal movement were reviewed in detail with the patient. Please refer to After Visit Summary  for other counseling recommendations.   Next MD visit is 6 weeks postpartum  Lesly Dukes, MD

## 2016-06-16 ENCOUNTER — Encounter (HOSPITAL_COMMUNITY): Payer: Self-pay | Admitting: *Deleted

## 2016-06-16 ENCOUNTER — Telehealth (HOSPITAL_COMMUNITY): Payer: Self-pay | Admitting: *Deleted

## 2016-06-16 NOTE — Telephone Encounter (Signed)
Preadmission screen  

## 2016-06-18 ENCOUNTER — Ambulatory Visit (INDEPENDENT_AMBULATORY_CARE_PROVIDER_SITE_OTHER): Payer: Medicaid Other | Admitting: General Practice

## 2016-06-18 ENCOUNTER — Other Ambulatory Visit: Payer: Self-pay | Admitting: Obstetrics & Gynecology

## 2016-06-18 ENCOUNTER — Ambulatory Visit (HOSPITAL_COMMUNITY)
Admission: RE | Admit: 2016-06-18 | Discharge: 2016-06-18 | Disposition: A | Discharge status: Home / Self Care | Payer: Medicaid Other | Source: Ambulatory Visit | Attending: Obstetrics & Gynecology | Admitting: Obstetrics & Gynecology

## 2016-06-18 ENCOUNTER — Encounter (HOSPITAL_COMMUNITY): Payer: Self-pay

## 2016-06-18 VITALS — BP 133/78 | HR 95

## 2016-06-18 DIAGNOSIS — Z6841 Body Mass Index (BMI) 40.0 and over, adult: Secondary | ICD-10-CM | POA: Diagnosis not present

## 2016-06-18 DIAGNOSIS — O10013 Pre-existing essential hypertension complicating pregnancy, third trimester: Secondary | ICD-10-CM

## 2016-06-18 DIAGNOSIS — O10919 Unspecified pre-existing hypertension complicating pregnancy, unspecified trimester: Secondary | ICD-10-CM

## 2016-06-18 DIAGNOSIS — O10913 Unspecified pre-existing hypertension complicating pregnancy, third trimester: Secondary | ICD-10-CM | POA: Diagnosis not present

## 2016-06-18 DIAGNOSIS — O99213 Obesity complicating pregnancy, third trimester: Secondary | ICD-10-CM | POA: Insufficient documentation

## 2016-06-18 DIAGNOSIS — Z3A38 38 weeks gestation of pregnancy: Secondary | ICD-10-CM | POA: Insufficient documentation

## 2016-06-20 ENCOUNTER — Encounter (HOSPITAL_COMMUNITY): Payer: Self-pay

## 2016-06-20 ENCOUNTER — Inpatient Hospital Stay (HOSPITAL_COMMUNITY): Payer: Medicaid Other | Admitting: Anesthesiology

## 2016-06-20 ENCOUNTER — Inpatient Hospital Stay (HOSPITAL_COMMUNITY)
Admission: RE | Admit: 2016-06-20 | Discharge: 2016-06-23 | DRG: 774 | Disposition: A | Discharge status: Home / Self Care | Payer: Medicaid Other | Source: Ambulatory Visit | Attending: Family Medicine | Admitting: Family Medicine

## 2016-06-20 VITALS — BP 130/63 | HR 92 | Temp 98.5°F | Resp 18 | Ht 63.0 in | Wt 310.0 lb

## 2016-06-20 DIAGNOSIS — Z3A39 39 weeks gestation of pregnancy: Secondary | ICD-10-CM

## 2016-06-20 DIAGNOSIS — O114 Pre-existing hypertension with pre-eclampsia, complicating childbirth: Principal | ICD-10-CM | POA: Diagnosis present

## 2016-06-20 DIAGNOSIS — E669 Obesity, unspecified: Secondary | ICD-10-CM | POA: Diagnosis present

## 2016-06-20 DIAGNOSIS — O99214 Obesity complicating childbirth: Secondary | ICD-10-CM | POA: Diagnosis present

## 2016-06-20 DIAGNOSIS — D696 Thrombocytopenia, unspecified: Secondary | ICD-10-CM

## 2016-06-20 DIAGNOSIS — Z6841 Body Mass Index (BMI) 40.0 and over, adult: Secondary | ICD-10-CM

## 2016-06-20 DIAGNOSIS — O1092 Unspecified pre-existing hypertension complicating childbirth: Secondary | ICD-10-CM | POA: Diagnosis not present

## 2016-06-20 DIAGNOSIS — O1002 Pre-existing essential hypertension complicating childbirth: Secondary | ICD-10-CM | POA: Diagnosis present

## 2016-06-20 DIAGNOSIS — Z8249 Family history of ischemic heart disease and other diseases of the circulatory system: Secondary | ICD-10-CM | POA: Diagnosis not present

## 2016-06-20 DIAGNOSIS — O10919 Unspecified pre-existing hypertension complicating pregnancy, unspecified trimester: Secondary | ICD-10-CM | POA: Diagnosis present

## 2016-06-20 DIAGNOSIS — Z87891 Personal history of nicotine dependence: Secondary | ICD-10-CM | POA: Diagnosis not present

## 2016-06-20 DIAGNOSIS — O0992 Supervision of high risk pregnancy, unspecified, second trimester: Secondary | ICD-10-CM

## 2016-06-20 DIAGNOSIS — O99119 Other diseases of the blood and blood-forming organs and certain disorders involving the immune mechanism complicating pregnancy, unspecified trimester: Secondary | ICD-10-CM

## 2016-06-20 LAB — COMPREHENSIVE METABOLIC PANEL
ALT: 19 U/L (ref 14–54)
AST: 25 U/L (ref 15–41)
Albumin: 3.1 g/dL — ABNORMAL LOW (ref 3.5–5.0)
Alkaline Phosphatase: 60 U/L (ref 38–126)
Anion gap: 9 (ref 5–15)
BUN: 6 mg/dL (ref 6–20)
CHLORIDE: 105 mmol/L (ref 101–111)
CO2: 22 mmol/L (ref 22–32)
CREATININE: 0.44 mg/dL (ref 0.44–1.00)
Calcium: 8.8 mg/dL — ABNORMAL LOW (ref 8.9–10.3)
GFR calc non Af Amer: >60 mL/min (ref >60)
Glucose, Bld: 85 mg/dL (ref 65–99)
POTASSIUM: 4 mmol/L (ref 3.5–5.1)
Sodium: 136 mmol/L (ref 135–145)
Total Bilirubin: 0.9 mg/dL (ref 0.3–1.2)
Total Protein: 6.2 g/dL — ABNORMAL LOW (ref 6.5–8.1)

## 2016-06-20 LAB — TYPE AND SCREEN
ABO/RH(D): O POS
ANTIBODY SCREEN: NEGATIVE

## 2016-06-20 LAB — CBC
HCT: 34.8 % — ABNORMAL LOW (ref 36.0–46.0)
HEMATOCRIT: 33.5 % — AB (ref 36.0–46.0)
Hemoglobin: 11.2 g/dL — ABNORMAL LOW (ref 12.0–15.0)
Hemoglobin: 11.6 g/dL — ABNORMAL LOW (ref 12.0–15.0)
MCH: 28.6 pg (ref 26.0–34.0)
MCH: 28.9 pg (ref 26.0–34.0)
MCHC: 33.4 g/dL (ref 30.0–36.0)
MCHC: 33.3 g/dL (ref 30.0–36.0)
MCV: 85.5 fL (ref 78.0–100.0)
MCV: 86.8 fL (ref 78.0–100.0)
Platelets: 153 10*3/uL (ref 150–400)
Platelets: 146 10*3/uL — ABNORMAL LOW (ref 150–400)
RBC: 3.92 MIL/uL (ref 3.87–5.11)
RBC: 4.01 MIL/uL (ref 3.87–5.11)
RDW: 15.6 % — AB (ref 11.5–15.5)
RDW: 15 % (ref 11.5–15.5)
WBC: 4 10*3/uL (ref 4.0–10.5)
WBC: 4.9 10*3/uL (ref 4.0–10.5)

## 2016-06-20 LAB — PROTEIN / CREATININE RATIO, URINE
Creatinine, Urine: 175 mg/dL
PROTEIN CREATININE RATIO: 0.22 mg/mg{creat} — AB (ref 0.00–0.15)
TOTAL PROTEIN, URINE: 39 mg/dL

## 2016-06-20 LAB — ABO/RH: ABO/RH(D): O POS

## 2016-06-20 LAB — RPR: RPR Ser Ql: NONREACTIVE

## 2016-06-20 MED ORDER — LACTATED RINGERS IV SOLN
500.0000 mL | INTRAVENOUS | Status: DC | PRN
  Administered 2016-06-21: 500 mL via INTRAVENOUS

## 2016-06-20 MED ORDER — EPHEDRINE 5 MG/ML INJ
10.0000 mg | INTRAVENOUS | Status: DC | PRN
  Filled 2016-06-20: qty 2

## 2016-06-20 MED ORDER — FLEET ENEMA 7-19 GM/118ML RE ENEM: 1.0000 | ENEMA | RECTAL | Status: DC | PRN

## 2016-06-20 MED ORDER — OXYTOCIN 40 UNITS IN LACTATED RINGERS INFUSION - SIMPLE MED
1.0000 m[IU]/min | INTRAVENOUS | Status: DC
  Administered 2016-06-20: 2 m[IU]/min via INTRAVENOUS
  Administered 2016-06-20: 4 m[IU]/min via INTRAVENOUS
  Filled 2016-06-20: qty 1000

## 2016-06-20 MED ORDER — LIDOCAINE HCL (PF) 1 % IJ SOLN
30.0000 mL | INTRAMUSCULAR | Status: DC | PRN
  Filled 2016-06-20: qty 30

## 2016-06-20 MED ORDER — OXYTOCIN 40 UNITS IN LACTATED RINGERS INFUSION - SIMPLE MED
2.5000 [IU]/h | INTRAVENOUS | Status: DC
  Administered 2016-06-21 (×2): 2.5 [IU]/h via INTRAVENOUS
  Filled 2016-06-20: qty 1000

## 2016-06-20 MED ORDER — MISOPROSTOL 25 MCG QUARTER TABLET: 25.0000 ug | ORAL_TABLET | ORAL | Status: DC

## 2016-06-20 MED ORDER — MISOPROSTOL 25 MCG QUARTER TABLET
25.0000 ug | ORAL_TABLET | ORAL | Status: DC | PRN
  Administered 2016-06-20 (×4): 25 ug via VAGINAL
  Filled 2016-06-20 (×5): qty 1

## 2016-06-20 MED ORDER — SOD CITRATE-CITRIC ACID 500-334 MG/5ML PO SOLN: 30.0000 mL | ORAL | Status: DC | PRN

## 2016-06-20 MED ORDER — ONDANSETRON HCL 4 MG/2ML IJ SOLN
4.0000 mg | Freq: Four times a day (QID) | INTRAMUSCULAR | Status: DC | PRN
  Administered 2016-06-21: 4 mg via INTRAVENOUS
  Filled 2016-06-20: qty 2

## 2016-06-20 MED ORDER — TERBUTALINE SULFATE 1 MG/ML IJ SOLN: 0.2500 mg | Freq: Once | INTRAMUSCULAR | Status: DC | PRN

## 2016-06-20 MED ORDER — FENTANYL 2.5 MCG/ML BUPIVACAINE 1/10 % EPIDURAL INFUSION (WH - ANES)
14.0000 mL/h | INTRAMUSCULAR | Status: DC | PRN
  Administered 2016-06-20 – 2016-06-21 (×4): 14 mL/h via EPIDURAL
  Filled 2016-06-20 (×3): qty 100

## 2016-06-20 MED ORDER — OXYTOCIN BOLUS FROM INFUSION: 500.0000 mL | Freq: Once | INTRAVENOUS | Status: DC

## 2016-06-20 MED ORDER — PHENYLEPHRINE 40 MCG/ML (10ML) SYRINGE FOR IV PUSH (FOR BLOOD PRESSURE SUPPORT)
80.0000 ug | PREFILLED_SYRINGE | INTRAVENOUS | Status: DC | PRN
  Filled 2016-06-20: qty 10
  Filled 2016-06-20: qty 5
  Filled 2016-06-20: qty 10

## 2016-06-20 MED ORDER — LACTATED RINGERS IV SOLN
INTRAVENOUS | Status: DC
  Administered 2016-06-20 (×3): via INTRAVENOUS

## 2016-06-20 MED ORDER — PHENYLEPHRINE 40 MCG/ML (10ML) SYRINGE FOR IV PUSH (FOR BLOOD PRESSURE SUPPORT)
80.0000 ug | PREFILLED_SYRINGE | INTRAVENOUS | Status: DC | PRN
  Filled 2016-06-20: qty 5
  Filled 2016-06-20: qty 10

## 2016-06-20 MED ORDER — ACETAMINOPHEN 325 MG PO TABS: 650.0000 mg | ORAL_TABLET | ORAL | Status: DC | PRN

## 2016-06-20 MED ORDER — OXYCODONE-ACETAMINOPHEN 5-325 MG PO TABS: 1.0000 | ORAL_TABLET | ORAL | Status: DC | PRN

## 2016-06-20 MED ORDER — OXYCODONE-ACETAMINOPHEN 5-325 MG PO TABS: 2.0000 | ORAL_TABLET | ORAL | Status: DC | PRN

## 2016-06-20 MED ORDER — LACTATED RINGERS IV SOLN
500.0000 mL | Freq: Once | INTRAVENOUS | Status: AC
  Administered 2016-06-20: 500 mL via INTRAVENOUS

## 2016-06-20 MED ORDER — DIPHENHYDRAMINE HCL 50 MG/ML IJ SOLN: 12.5000 mg | INTRAMUSCULAR | Status: DC | PRN

## 2016-06-20 MED ORDER — FENTANYL CITRATE (PF) 100 MCG/2ML IJ SOLN
100.0000 ug | INTRAMUSCULAR | Status: DC | PRN
  Administered 2016-06-20 (×5): 100 ug via INTRAVENOUS
  Filled 2016-06-20 (×6): qty 2

## 2016-06-20 MED ORDER — LABETALOL HCL 100 MG PO TABS
100.0000 mg | ORAL_TABLET | Freq: Two times a day (BID) | ORAL | Status: DC
  Administered 2016-06-20 – 2016-06-21 (×2): 100 mg via ORAL
  Filled 2016-06-20: qty 1
  Filled 2016-06-20: qty 0.5

## 2016-06-20 NOTE — H&P (Signed)
LABOR ADMISSION HISTORY AND PHYSICAL  Jill Orozco is a 26 y.o. female G1P0 with IUP at [redacted]w[redacted]d by LMP and 14w [place] presenting for IOL for cHTN. She reports no contraction. Fetal movement present and at baseline. Denies fluid leak or gush, vaginal bleeding,  headaches, blurry vision, RUQ pain or peripheral edema. She plans on breast feeding. She request IUD-mirena for birth control. Likes to have epidural for pain control when she is in active labor.   Dating: By LMP and 14w [place] --->  Estimated Date of Delivery: 06/27/16  Sono:    , CWD, normal anatomy, cephalic presentation, long lie, 3390g, 72% EFW  Prenatal History/Complications:  [facility] Prenatal Labs  Dating LMP, c/w 14 week [place] Blood type: O/POS/-- (10/19 1416)   Genetic Screen Quad: neg     Antibody:NEG (10/19 1416)  Anatomic [place]  neg Rubella: 4.18 (10/19 1416)  GTT Early: 98        Third trimester: nl 2hr RPR: NON REAC (10/19 1416)   Flu vaccine   declined HBsAg: NEGATIVE (10/19 1416)   TDaP vaccine   04/06/16                                         HIV: NONREACTIVE (10/19 1416)   Baby Food  Breast                                          GBS: negative  Contraception Ln IUD Pap:12/2015 Negative  Circumcision N/a girl   Pediatrician Given list   Support Person   Mom: Jacqlyn Krauss      Past Medical History: Past Medical History:  Diagnosis Date  . Hypertension     Past Surgical History: Past Surgical History:  Procedure Laterality Date  . NO PAST SURGERIES      Obstetrical History: OB History    Gravida Para Term Preterm AB Living   1         0   SAB TAB Ectopic Multiple Live Births                  Social History: Social History   Social History  . Marital status: Single    Spouse name: N/A  . Number of children: N/A  . Years of education: N/A   Social History Main Topics  . Smoking status: Former Smoker    Types: Cigars  . Smokeless tobacco: Never Used  . Alcohol use Yes     Comment:  occasionally before pregnancy  . Drug use: No  . Sexual activity: Yes    Birth control/ protection: None   Other Topics Concern  . None   Social History Narrative  . None    Family History: Family History  Problem Relation Age of Onset  . Hypertension Mother     Allergies: No Known Allergies  Prescriptions Prior to Admission  Medication Sig Dispense Refill Last Dose  . calcium carbonate (TUMS EX) 750 MG chewable tablet Chew 4-6 tablets by mouth daily as needed for heartburn.   06/19/2016 at Unknown time  . famotidine (PEPCID) 20 MG tablet Take 1 tablet (20 mg total) by mouth 2 (two) times daily. 60 tablet 3 06/18/2016  . labetalol (NORMODYNE) 200 MG tablet Take 0.5 tablets (100 mg total) by mouth 2 (  two) times daily. 60 tablet 3 06/18/2016 at 2000     Review of Systems  Blood pressure 137/80, pulse 97, temperature 98.3 F (36.8 C), temperature source Oral, resp. rate 17, height  (1.6 m), weight (!) 310 lb (140.6 kg), last menstrual period 09/21/2015. GEN: appearance: alert, cooperative and appears stated age RESP: clear to auscultation bilaterally, no increased WOB CVS:: regular rate and rhythm, no murmurs, no sign of DVT, +2 DP GI: soft, non-tender; bowel sounds normal MSK: WWP, Homans sign is negative,  NEURO: patellar DTRs normal PSYCH:  Pelvic Exam: Cervical exam: Dilation: 1 Effacement (%): Thick Station: -3 Exam by:: Cherre Blanc Presentation: cephalic Uterine activity: none  Fetal monitoringBaseline: 140 bpm, Variability: Good {> 6 bpm), Accelerations: Reactive and Decelerations: Absent  Prenatal labs: ABO, Rh: --/--/O POS, O POS (04/14 0130) Antibody: NEG (04/14 0130) Rubella: !Error! RPR: NON REAC (01/29 0942)  HBsAg: NEGATIVE (10/19 1416)  HIV: NONREACTIVE (01/29 0942)  GBS: Negative (03/26 1025)  1 hr Glucola normal Genetic screening  normal Anatomy US normal  Prenatal Transfer Tool  Maternal Diabetes: No Genetic Screening:  Normal Maternal Ultrasounds/Referrals: Normal Fetal Ultrasounds or other Referrals:  Referred to Materal Fetal Medicine  Maternal Substance Abuse:  No Significant Maternal Medications:  Meds include: Other: labetalol Significant Maternal Lab Results: None  Results for orders placed or performed during the hospital encounter of 06/20/16 (from the past 24 hour(s))  Type and screen [facility] [place]   Collection Time: 06/20/16  1:30 AM  Result Value Ref Range   ABO/RH(D) O POS    Antibody Screen NEG    Sample Expiration 06/23/2016   ABO/Rh   Collection Time: 06/20/16  1:30 AM  Result Value Ref Range   ABO/RH(D) O POS   CBC   Collection Time: 06/20/16  1:36 AM  Result Value Ref Range   WBC 4.0 4.0 - 10.5 K/uL   RBC 3.92 3.87 - 5.11 MIL/uL   Hemoglobin 11.2 (L) 12.0 - 15.0 g/dL   HCT 57.8 (L) 46.9 - 62.9 %   MCV 85.5 78.0 - 100.0 fL   MCH 28.6 26.0 - 34.0 pg   MCHC 33.4 30.0 - 36.0 g/dL   RDW 52.8 (H) 41.3 - 24.4 %   Platelets 153 150 - 400 K/uL    Patient Active Problem List   Diagnosis Date Noted  . Chronic hypertension affecting pregnancy 06/20/2016  . Obesity in pregnancy 01/23/2016  . BMI 50.0-59.9, adult (HCC) 01/23/2016  . Supervision of high risk pregnancy, antepartum, second trimester 12/26/2015  . Chronic hypertension during pregnancy, antepartum 12/10/2015    Assessment: Denette Hass is a 26 y.o. G1P0 at [redacted]w[redacted]d here for IOL for cHTN  #Labor: IOL for cHTN. Vaginal cytotec #Pain: As needed pain meds. Likes to have epidural when in active labor #FWB: CAT-1 #ID: GBS neg #MOF: breast #MOC: ln-IUD #Circ: n/a  Almon Hercules 06/20/2016, 9:31 AM   CNM attestation:  I have seen and examined this patient; I agree with above documentation in the resident's note.   Margaurite Salido is a 26 y.o. G1P1001 here for IOL due to cHTN  PE: BP 130/63 (BP Location: Left Arm)   Pulse 92   Temp 98.5 F (36.9 C) (Oral)   Resp 18   Ht  (1.6 m)    Wt (!) 140.6 kg (310 lb)   LMP 09/21/2015   SpO2 97%   Breastfeeding? Unknown   BMI 54.91 kg/m  Gen: calm comfortable, NAD Resp: normal  effort, no distress Abd: gravid  ROS, labs, PMH reviewed  Plan: Admit to [employer] Cx unfavorable so plan cytotec followed by foley when able Watch BPs- no evidence of pre-e at present Anticipate SVD  Cam Hai CNM 06/24/2016, 10:24 PM

## 2016-06-20 NOTE — Progress Notes (Signed)
Halli Equihua is a 26 y.o. G1P0 at [redacted]w[redacted]d with CHTN Subjective:   Objective: BP 134/60   Pulse (!) 101   Temp 98.8 F (37.1 C) (Oral)   Resp 17   Ht  (1.6 m)   Wt (!) 310 lb (140.6 kg)   LMP 09/21/2015   BMI 54.91 kg/m  No intake/output data recorded. No intake/output data recorded.  FHT:  FHR: 140 bpm, variability: moderate,  accelerations:  Present,  decelerations:  Absent UC:   None on toco external SVE:   Dilation: 3 Effacement (%): 70 Station: -2 Exam by:: Dr.Sheree Lalla  Labs: Lab Results  Component Value Date   WBC 4.0 06/20/2016   HGB 11.2 (L) 06/20/2016   HCT 33.5 (L) 06/20/2016   MCV 85.5 06/20/2016   PLT 153 06/20/2016    Assessment / Plan: Induction for CHTN; foley; add pitocin.  Elsie Lincoln 06/20/2016, 7:54 PM

## 2016-06-20 NOTE — Anesthesia Pain Management Evaluation Note (Signed)
  CRNA Pain Management Visit Note  Patient: Jill Orozco, 26 y.o., female  "Hello I am a member of the anesthesia team at [facility] We have an anesthesia team available at all times to provide care throughout the hospital, including epidural management and anesthesia for C-section. I don't know your plan for the delivery whether it a natural birth, water birth, IV sedation, nitrous supplementation, doula or epidural, but we want to meet your pain goals."   1.Was your pain managed to your expectations on prior hospitalizations?   No prior hospitalizations  2.What is your expectation for pain management during this hospitalization?     Epidural and IV pain meds  3.How can we help you reach that goal? IV pain meds, epidural when ready.  Record the patient's initial score and the patient's pain goal.   Pain: 7  Pain Goal: 8 [facility] wants you to be able to say your pain was always managed very well.  Tuana Hoheisel L 06/20/2016

## 2016-06-20 NOTE — Progress Notes (Signed)
Labor Progress Note Jill Orozco is a 26 y.o. G1P0 at [redacted]w[redacted]d presented for IOL for cHTN S: no complaint. Denies headache, vision changes or shortness of breath.   O:  BP 139/77   Pulse (!) 103   Temp 98.8 F (37.1 C) (Oral)   Resp 17   Ht  (1.6 m)   Wt (!) 310 lb (140.6 kg)   LMP 09/21/2015   BMI 54.91 kg/m  EFM: 140/mod var/no decels  CVE: Dilation: 3 Effacement (%): 70 Station: -2 Presentation: Vertex Exam by:: Dr.Leggett   A&P: 26 y.o. G1P0 [redacted]w[redacted]d IOL for cHTN #cHTN: BP within normal range. Continue home labetalol 100 mg twice a day.  #Labor: s/p FB and Cytotec. Starting pit #Pain: Epidural up on request #FWB: CAT-1 #GBS: neg  Almon Hercules, MD 10:01 PM

## 2016-06-20 NOTE — Progress Notes (Signed)
Patient ID: Jill Orozco, female   DOB: 03-Oct-1990, 26 y.o.   MRN: [id number]  s/p cytotec x 2 doses. Pt feeling crampy. Exam to determine if foley able to be placed- ant/curves upward/1/thick/-3- did not attempt. FHR stable BP 140/83  Will continue w/ q 4hr cytotec until foley able to be placed.  Cam Hai CNM 06/20/2016 7:12 AM

## 2016-06-20 NOTE — Progress Notes (Signed)
Patient ID: Jill Orozco, female   DOB: 21-Oct-1990, 26 y.o.   MRN: [id number]  S: Patient seen & examined for progress of labor. Patient comfortable.    O:  Vitals:   06/20/16 0048 06/20/16 0614 06/20/16 0733 06/20/16 1010  BP: (!) 141/83 140/83 137/80 128/77  Pulse: 93 89 97 89  Resp: Temp: 99.1 F (37.3 C) 98.2 F (36.8 C) 98.3 F (36.8 C)   TempSrc: Oral  Oral   Weight: (!) 310 lb (140.6 kg)     Height:  (1.6 m)       Dilation: 1 Effacement (%): 60 Station: -3 Presentation: Vertex Exam by:: Dr. Omer Jack   FHT: 130 bpm, mod var, +accels, no decels TOCO: Irritable   A/P: Foley bulb attempted, unable to be placed again Continue cytotec, will attempt FB at next dose. Continue expectant management Anticipate SVD

## 2016-06-21 ENCOUNTER — Encounter (HOSPITAL_COMMUNITY): Payer: Self-pay

## 2016-06-21 DIAGNOSIS — Z3A39 39 weeks gestation of pregnancy: Secondary | ICD-10-CM

## 2016-06-21 DIAGNOSIS — O1092 Unspecified pre-existing hypertension complicating childbirth: Secondary | ICD-10-CM

## 2016-06-21 LAB — CBC
HCT: 34.7 % — ABNORMAL LOW (ref 36.0–46.0)
Hemoglobin: 11.6 g/dL — ABNORMAL LOW (ref 12.0–15.0)
MCH: 29.1 pg (ref 26.0–34.0)
MCHC: 33.4 g/dL (ref 30.0–36.0)
MCV: 87 fL (ref 78.0–100.0)
PLATELETS: 140 10*3/uL — AB (ref 150–400)
RBC: 3.99 MIL/uL (ref 3.87–5.11)
RDW: 15 % (ref 11.5–15.5)
WBC: 9.4 10*3/uL (ref 4.0–10.5)

## 2016-06-21 MED ORDER — MISOPROSTOL 200 MCG PO TABS
ORAL_TABLET | ORAL | Status: AC
  Administered 2016-06-21: 400 ug via BUCCAL
  Filled 2016-06-21: qty 2

## 2016-06-21 MED ORDER — TETANUS-DIPHTH-ACELL PERTUSSIS 5-2.5-18.5 LF-MCG/0.5 IM SUSP: 0.5000 mL | Freq: Once | INTRAMUSCULAR | Status: DC

## 2016-06-21 MED ORDER — PRENATAL MULTIVITAMIN CH
1.0000 | ORAL_TABLET | Freq: Every day | ORAL | Status: DC
  Administered 2016-06-22 – 2016-06-23 (×2): 1 via ORAL
  Filled 2016-06-21 (×2): qty 1

## 2016-06-21 MED ORDER — ACETAMINOPHEN 325 MG PO TABS
650.0000 mg | ORAL_TABLET | ORAL | Status: DC | PRN
  Administered 2016-06-23: 650 mg via ORAL
  Filled 2016-06-21: qty 2

## 2016-06-21 MED ORDER — ZOLPIDEM TARTRATE 5 MG PO TABS: 5.0000 mg | ORAL_TABLET | Freq: Every evening | ORAL | Status: DC | PRN

## 2016-06-21 MED ORDER — MISOPROSTOL 200 MCG PO TABS
ORAL_TABLET | ORAL | Status: AC
  Filled 2016-06-21: qty 1

## 2016-06-21 MED ORDER — HYDRALAZINE HCL 20 MG/ML IJ SOLN: 10.0000 mg | Freq: Once | INTRAMUSCULAR | Status: DC | PRN

## 2016-06-21 MED ORDER — ONDANSETRON HCL 4 MG PO TABS: 4.0000 mg | ORAL_TABLET | ORAL | Status: DC | PRN

## 2016-06-21 MED ORDER — DIPHENHYDRAMINE HCL 25 MG PO CAPS: 25.0000 mg | ORAL_CAPSULE | Freq: Four times a day (QID) | ORAL | Status: DC | PRN

## 2016-06-21 MED ORDER — DIBUCAINE 1 % RE OINT: 1.0000 "application " | TOPICAL_OINTMENT | RECTAL | Status: DC | PRN

## 2016-06-21 MED ORDER — WITCH HAZEL-GLYCERIN EX PADS: 1.0000 "application " | MEDICATED_PAD | CUTANEOUS | Status: DC | PRN

## 2016-06-21 MED ORDER — COCONUT OIL OIL: 1.0000 "application " | TOPICAL_OIL | Status: DC | PRN

## 2016-06-21 MED ORDER — MAGNESIUM SULFATE 40 G IN LACTATED RINGERS - SIMPLE
2.0000 g/h | INTRAVENOUS | Status: AC
  Administered 2016-06-21: 2 g/h via INTRAVENOUS
  Filled 2016-06-21: qty 40
  Filled 2016-06-21: qty 500

## 2016-06-21 MED ORDER — SIMETHICONE 80 MG PO CHEW: 80.0000 mg | CHEWABLE_TABLET | ORAL | Status: DC | PRN

## 2016-06-21 MED ORDER — BENZOCAINE-MENTHOL 20-0.5 % EX AERO
1.0000 "application " | INHALATION_SPRAY | CUTANEOUS | Status: DC | PRN
  Filled 2016-06-21: qty 56

## 2016-06-21 MED ORDER — LABETALOL HCL 100 MG PO TABS
100.0000 mg | ORAL_TABLET | Freq: Two times a day (BID) | ORAL | Status: DC
  Administered 2016-06-21 – 2016-06-23 (×4): 100 mg via ORAL
  Filled 2016-06-21 (×4): qty 1

## 2016-06-21 MED ORDER — LIDOCAINE HCL (PF) 1 % IJ SOLN
INTRAMUSCULAR | Status: DC | PRN
  Administered 2016-06-20 (×2): 5 mL via EPIDURAL

## 2016-06-21 MED ORDER — MISOPROSTOL 200 MCG PO TABS
400.0000 ug | ORAL_TABLET | Freq: Once | ORAL | Status: AC
  Administered 2016-06-21: 400 ug via BUCCAL

## 2016-06-21 MED ORDER — SENNOSIDES-DOCUSATE SODIUM 8.6-50 MG PO TABS
2.0000 | ORAL_TABLET | ORAL | Status: DC
  Administered 2016-06-22 – 2016-06-23 (×2): 2 via ORAL
  Filled 2016-06-21 (×2): qty 2

## 2016-06-21 MED ORDER — MAGNESIUM SULFATE BOLUS VIA INFUSION
4.0000 g | Freq: Once | INTRAVENOUS | Status: AC
  Administered 2016-06-21: 4 g via INTRAVENOUS
  Filled 2016-06-21: qty 500

## 2016-06-21 MED ORDER — IBUPROFEN 600 MG PO TABS
600.0000 mg | ORAL_TABLET | Freq: Four times a day (QID) | ORAL | Status: DC
  Administered 2016-06-21 – 2016-06-23 (×8): 600 mg via ORAL
  Filled 2016-06-21 (×8): qty 1

## 2016-06-21 MED ORDER — ONDANSETRON HCL 4 MG/2ML IJ SOLN: 4.0000 mg | INTRAMUSCULAR | Status: DC | PRN

## 2016-06-21 MED ORDER — LABETALOL HCL 5 MG/ML IV SOLN
20.0000 mg | INTRAVENOUS | Status: DC | PRN
  Administered 2016-06-21: 20 mg via INTRAVENOUS
  Filled 2016-06-21: qty 4

## 2016-06-21 NOTE — Progress Notes (Signed)
Labor Progress Note Angelli Baruch is a 26 y.o. G1P0 at [redacted]w[redacted]d presented for IOL for cHTN S: reports feeling nauseas and some contractions. Denies headache, vision changes or shortness of breath.   O:  BP 140/77   Pulse (!) 106   Temp 99.1 F (37.3 C) (Oral)   Resp 18   Ht  (1.6 m)   Wt (!) 310 lb (140.6 kg)   LMP 09/21/2015   SpO2 100%   BMI 54.91 kg/m  EFM: 135/mod var/no decels  CVE: Dilation: 7 Effacement (%): 80 Cervical Position: Anterior Station: -2 Presentation: Vertex Exam by:: dr Alanda Slim   A&P: 26 y.o. G1P0 [redacted]w[redacted]d IOL for cHTN #SIPE: BP in normal range after Mg. PIH labs normal. Continue Mg, labetalol protocol & labetalol 100 mg BID.  #Labor: pit titration per RN. She is at [facility] now. #Pain: Epidural up on request #FWB: CAT-1 #GBS: neg  Almon Hercules, MD 5:35 AM

## 2016-06-21 NOTE — Progress Notes (Signed)
1610 pt offered peanut ball, benefits discussed with pt-pt declined at this time.

## 2016-06-21 NOTE — Anesthesia Procedure Notes (Signed)
Epidural  Start time: 06/20/2016 11:40 PM  Staffing Anesthesiologist: Odette Fraction Performed: anesthesiologist   Preanesthetic Checklist Completed: patient identified, site marked, surgical consent, pre-op evaluation, timeout performed, IV checked, risks and benefits discussed and monitors and equipment checked  Epidural Patient position: sitting Prep: DuraPrep Patient monitoring: heart rate, continuous pulse ox and blood pressure Approach: midline Location: L4-L5 Injection technique: LOR air and LOR saline  Needle:  Needle type: Tuohy  Needle gauge: 18 G Needle length: 15 cm Needle insertion depth: 12 cm Catheter size: 20 Guage Catheter at skin depth: 20 cm Test dose: Other  Additional Notes I was present and participated in the regional anesthetic procedure key events.  By signing, I attest that I have identified and re-evaluated the patient immediately before the induction of anesthesia and I am satisfied that my anesthetic plan is suitable for the patient's condition and procedure. Test dose with 5 mL 1% lidocaine; negative for block, tinnitus, perioral numbness. 5 mL more admin and pt reported some "warmth" in her feet, developing expected analgesiaReason for block:procedure for pain

## 2016-06-21 NOTE — Anesthesia Preprocedure Evaluation (Signed)
Anesthesia Evaluation  Patient identified by MRN, date of birth, ID band Patient awake    Airway Mallampati: II  TM Distance: >3 FB Neck ROM: Full    Dental no notable dental hx.    Pulmonary neg pulmonary ROS, former smoker,    Pulmonary exam normal        Cardiovascular Pt. on medications (-) angina(-) Past MI and (-) PND Normal cardiovascular exam     Neuro/Psych negative neurological ROS  negative psych ROS   GI/Hepatic negative GI ROS,   Endo/Other  negative endocrine ROS  Renal/GU negative Renal ROS  negative genitourinary   Musculoskeletal negative musculoskeletal ROS (+)   Abdominal (+) + obese,   Peds negative pediatric ROS (+)  Hematology negative hematology ROS (+)   Anesthesia Other Findings   Reproductive/Obstetrics (+) Pregnancy                             Anesthesia Physical Anesthesia Plan  ASA: III  Anesthesia Plan: Epidural   Post-op Pain Management:    Induction:   Airway Management Planned: Simple Face Mask  Additional Equipment:   Intra-op Plan:   Post-operative Plan:   Informed Consent: I have reviewed the patients History and Physical, chart, labs and discussed the procedure including the risks, benefits and alternatives for the proposed anesthesia with the patient or authorized representative who has indicated his/her understanding and acceptance.     Plan Discussed with:   Anesthesia Plan Comments: (  By signing, I attest that I have identified and re-evaluated the patient immediately before the induction of anesthesia and I am satisfied that my anesthetic plan is suitable for the patient's condition and procedure.  The first vital signs recorded are pre-induction.)        Anesthesia Quick Evaluation

## 2016-06-21 NOTE — Progress Notes (Signed)
DLawson,cnm notiifid of urinary output 25ml this past hour and bloody, order received for 500lr bolus. Provider reviewing fhr tracing for decreased variability but positive scalp stimulation.

## 2016-06-21 NOTE — Progress Notes (Signed)
   Subjective: Jill Orozco is a 26 y.o. G1P0 at [redacted]w[redacted]d by LMP admitted for induction of labor due to Hypertension.  Objective: BP 134/78   Pulse (!) 116   Temp 98.1 F (36.7 C) (Axillary)   Resp 18   Ht  (1.6 m)   Wt (!) 140.6 kg (310 lb)   LMP 09/21/2015   SpO2 100%   BMI 54.91 kg/m  I/O last 3 completed shifts: In: 647 [P.O.:360; I.V.:287] Out: 400 [Urine:400] Total I/O In: 620 [I.V.:620] Out: 675 [Urine:275; Emesis/NG output:400]  FHT:  FHR: 130 bpm, variability: minimal ,  accelerations:  Abscent,  decelerations:  Absent UC:   regular, every 2 minutes / MVUs 105-180 SVE:   Dilation: Lip/rim Effacement (%): 100 Station: +1 Exam by:: Carloyn Jaeger, CNM  Labs: Lab Results  Component Value Date   WBC 4.9 06/20/2016   HGB 11.6 (L) 06/20/2016   HCT 34.8 (L) 06/20/2016   MCV 86.8 06/20/2016   PLT 146 (L) 06/20/2016    Assessment / Plan: Induction of labor due to cHTN,  progressing well on pitocin  Labor: Progressing on Pitocin Preeclampsia:  on magnesium sulfate, labs stable and on Labetalol 100 bid Fetal Wellbeing:  Category II Pain Control:  Epidural I/D:  n/a Anticipated MOD:  NSVD  Raelyn Mora, M MSN, CNM 06/21/2016, 10:44 AM

## 2016-06-21 NOTE — Progress Notes (Signed)
Patient ID: Jill Orozco, female   DOB: 07-05-1990, 26 y.o.   MRN: [id number]  S: Patient seen & examined for progress of labor. Patient comfortable with epidural.    O:  Vitals:   06/21/16 0333 06/21/16 0403 06/21/16 0433 06/21/16 0503  BP: (!) 145/74 129/87 127/72 140/77  Pulse: (!) 102 (!) 103 (!) 103 (!) 106  Resp:      Temp:      TempSrc:      SpO2:      Weight:      Height:        Dilation: 5.5 Effacement (%): 80 Cervical Position: Anterior Station: -2 Presentation: Vertex Exam by:: dr Alanda Slim - Unclear time of ROM, patient states she thinks when her FB came out  FHT: 135bpm, mod var, +accels, no decels TOCO: q1.5-24min   A/P: CHTN SIPE with severe features (severe BPs) - Cont Mag gtt - IV labetalol push prn, better controlled IUPC placed - Continue increasing pitocin Continue expectant management Anticipate SVD

## 2016-06-21 NOTE — Progress Notes (Signed)
Labor Progress Note Jill Orozco is a 26 y.o. G1P0 at [redacted]w[redacted]d presented for IOL for cHTN S: reports feeling nauseas. FB just came out. Denies headache, vision changes or shortness of breath.   O:  BP (!) 171/90   Pulse (!) 101   Temp 99.1 F (37.3 C) (Oral)   Resp 16   Ht  (1.6 m)   Wt (!) 310 lb (140.6 kg)   LMP 09/21/2015   SpO2 100%   BMI 54.91 kg/m  EFM: 140/mod var/no decels  CVE: Dilation: 3 Effacement (%): 70 Station: -2 Presentation: Vertex Exam by:: Dr.Leggett   A&P: 26 y.o. G1P0 [redacted]w[redacted]d IOL for cHTN #SIPE: BP in severe range. PIH labs within normal. Start Mg, labetalol protocol. Continue home labetalol 100 mg twice a day. May consider increasing if BP remains high.  #Labor: s/p FB and Cytotec. Starting pit #Pain: Epidural up on request #FWB: CAT-1 #GBS: neg  Almon Hercules, MD 12:37 AM

## 2016-06-22 LAB — CBC
HEMATOCRIT: 28.7 % — AB (ref 36.0–46.0)
HEMOGLOBIN: 9.5 g/dL — AB (ref 12.0–15.0)
MCH: 28.8 pg (ref 26.0–34.0)
MCHC: 33.1 g/dL (ref 30.0–36.0)
MCV: 87 fL (ref 78.0–100.0)
Platelets: 128 10*3/uL — ABNORMAL LOW (ref 150–400)
RBC: 3.3 MIL/uL — ABNORMAL LOW (ref 3.87–5.11)
RDW: 14.9 % (ref 11.5–15.5)
WBC: 7.7 10*3/uL (ref 4.0–10.5)

## 2016-06-22 MED ORDER — LACTATED RINGERS IV SOLN
INTRAVENOUS | Status: DC
  Administered 2016-06-22: 06:00:00 via INTRAVENOUS

## 2016-06-22 NOTE — Lactation Note (Signed)
This note was copied from a baby's chart. Lactation Consultation Note  Patient Name: Jill Orozco [id number] Date: 06/22/2016 Reason for consult: Initial assessment Baby 21 hours old. Mom reports that baby is latching much better now, and she nursed about 20 minutes at the last BF. Parents report that baby has had several stools--charted--but has not voided yet. Mom able to easily hand express with EBM flowing. Enc mom to offer lots of STS and nurse with cues. Mom given [facility] brochure, aware of OP/BFSG and LC phone line assistance after D/C.   Maternal Data    Feeding Feeding Type: Breast Fed Length of feed: 20 min  LATCH Score/Interventions                      Lactation Tools Discussed/Used     Consult Status Consult Status: Follow-up Date: 06/23/16 Follow-up type: In-patient    Sherlyn Hay 06/22/2016, 10:26 AM

## 2016-06-22 NOTE — Anesthesia Postprocedure Evaluation (Signed)
Anesthesia Post Note  Patient: Jill Orozco  Procedure(s) Performed: * No procedures listed *  Patient location during evaluation: [facility] Anesthesia Type: Epidural Level of consciousness: awake and alert and oriented Pain management: pain level controlled Vital Signs Assessment: post-procedure vital signs reviewed and stable Respiratory status: spontaneous breathing and nonlabored ventilation Cardiovascular status: stable Postop Assessment: no headache, patient able to bend at knees, no backache, no signs of nausea or vomiting, epidural receding and adequate PO intake Anesthetic complications: no        Last Vitals:  Vitals:   06/22/16 0510 06/22/16 0602  BP:    Pulse:    Resp: 16 15  Temp:      Last Pain:  Vitals:   06/22/16 0642  TempSrc:   PainSc: 0-No pain   Pain Goal: Patients Stated Pain Goal: 3 (06/22/16 1610)               Laban Emperor

## 2016-06-22 NOTE — Progress Notes (Signed)
Post Partum Day 1 Subjective: no complaints, up ad lib, voiding and tolerating PO  Objective: Blood pressure (!) 113/57, pulse 83, temperature 97.9 F (36.6 C), temperature source Oral, resp. rate 15, height  (1.6 m), weight (!) 310 lb (140.6 kg), last menstrual period 09/21/2015, SpO2 98 %, unknown if currently breastfeeding.  Physical Exam:  General: alert, cooperative and appears stated age Lochia: appropriate Uterine Fundus: firm DVT Evaluation: No evidence of DVT seen on physical exam.   Recent Labs  06/21/16 1353 06/22/16 0554  HGB 11.6* 9.5*  HCT 34.7* 28.7*    Assessment/Plan: Plan for discharge tomorrow, Discharge home, Breastfeeding, Lactation consult and Contraception Mirena   LOS: 2 days   Reva Bores 06/22/2016, 8:12 AM

## 2016-06-23 DIAGNOSIS — D696 Thrombocytopenia, unspecified: Secondary | ICD-10-CM

## 2016-06-23 DIAGNOSIS — O99119 Other diseases of the blood and blood-forming organs and certain disorders involving the immune mechanism complicating pregnancy, unspecified trimester: Secondary | ICD-10-CM

## 2016-06-23 MED ORDER — PRENATAL MULTIVITAMIN CH: 1.0000 | ORAL_TABLET | Freq: Every day | ORAL | 2 refills | Status: DC

## 2016-06-23 MED ORDER — IBUPROFEN 600 MG PO TABS: 600.0000 mg | ORAL_TABLET | Freq: Four times a day (QID) | ORAL | 1 refills | Status: DC | PRN

## 2016-06-23 MED ORDER — OXYCODONE-ACETAMINOPHEN 5-325 MG PO TABS: 1.0000 | ORAL_TABLET | Freq: Four times a day (QID) | ORAL | 0 refills | Status: DC | PRN

## 2016-06-23 NOTE — Discharge Summary (Signed)
Obstetrical Discharge Summary  Date of Admission: 06/20/2016 Date of Discharge: 06/23/2016  Primary OB: [facility]  Gestational Age at Delivery: [redacted]w[redacted]d   Antepartum complications: cHTN, BMI 72s Reason for Admission: IOL for cHTN Date of Delivery: 06/21/2016 Delivered By: Raelyn Mora, CNM Delivery Type: spontaneous vaginal delivery Intrapartum complications/course: superimposed severe pre-eclampsia Anesthesia: epidural Placenta: Delivered and expressed via active management. Intact: yes. To pathology: no.  Laceration: 2nd degree Episiotomy: none EBL: Baby: Liveborn female, APGARs 9/9, weight 2850 g.    Discharge Diagnosis: Delivered. Same  Postpartum course: Patient had 24hrs of ppMg and had an uncomplicated PP course.  Discharge Vital Signs:  Current Vital Signs 24h Vital Sign Ranges  T 98.5 F (36.9 C) Temp  Avg: 98.2 F (36.8 C)  Min: 97.6 F (36.4 C)  Max: 98.6 F (37 C)  BP 130/68 BP  Min: 119/53  Max: 146/71  HR 98 Pulse  Avg: 95.7  Min: 89  Max: 100  RR 18 Resp  Avg: 16.8  Min: 16  Max: 18  SaO2 97 % Not Delivered SpO2  Avg: 98.7 %  Min: 97 %  Max: 100 %       24 Hour I/O Current Shift I/O  Time Ins Outs 04/16 0701 - 04/17 0700 In: 1343.3 [P.O.:320; I.V.:1023.3] Out: 1200 [Urine:1200] No intake/output data recorded.    Discharge Exam:  NAD Perineum: deferred Abdomen: firm fundus below the umbilicus. Obese, nttp RRR no MRGs CTAB   Recent Labs Lab 06/20/16 2212 06/21/16 1353 06/22/16 0554  WBC 4.9 9.4 7.7  HGB 11.6* 11.6* 9.5*  HCT 34.8* 34.7* 28.7*  PLT 146* 140* 128*   CMP Latest Ref Rng & Units 06/20/2016 12/26/2015  Glucose 65 - 99 mg/dL 85 95  BUN 6 - 20 mg/dL 6 5(L)  Creatinine 1.61 - 1.00 mg/dL 0.96 0.45  Sodium 409 - 145 mmol/L 136 137  Potassium 3.5 - 5.1 mmol/L 4.0 3.8  Chloride 101 - 111 mmol/L 105 104  CO2 22 - 32 mmol/L 22 24  Calcium 8.9 - 10.3 mg/dL 8.1(X) 9.3  Total Protein 6.5 - 8.1 g/dL 6.2(L) 6.7   Total Bilirubin 0.3 - 1.2 mg/dL 0.9 0.6  Alkaline Phos 38 - 126 U/L 60 35  AST 15 - 41 U/L 25 15  ALT 14 - 54 U/L 19 12    Disposition: Home  Rh Immune globulin given: not applicable Rubella vaccine given: not applicable Tdap vaccine given in AP or PP setting: yes  Contraception: LNG IUD in clinic  Prenatal/Postnatal Panel: O POS//Rubella Immune//Varicella Unknown//RPR negative//HIV negative/HepB Surface Ag negative//pap no abnormalities (date: 2017)//plans to breastfeed  Plan:  Jill Orozco was discharged to home in good condition. Follow-up appointment with baby love in 1 week for a BP in home visit. Pt to call for 5-6wk regular PP visit. Recommend rechecking platelets at that time.   Discharge Medications: Allergies as of 06/23/2016   No Known Allergies     Medication List    TAKE these medications   calcium carbonate 750 MG chewable tablet Commonly known as:  TUMS EX Chew 4-6 tablets by mouth daily as needed for heartburn.   famotidine 20 MG tablet Commonly known as:  PEPCID Take 1 tablet (20 mg total) by mouth 2 (two) times daily.   ibuprofen 600 MG tablet Commonly known as:  ADVIL,MOTRIN Take 1 tablet (600 mg total) by mouth every 6 (six) hours as needed for mild pain or cramping.   labetalol 200 MG tablet  Commonly known as:  NORMODYNE Take 0.5 tablets (100 mg total) by mouth 2 (two) times daily.   oxyCODONE-acetaminophen 5-325 MG tablet Commonly known as:  PERCOCET/ROXICET Take 1-2 tablets by mouth every 6 (six) hours as needed.   prenatal multivitamin Tabs tablet Take 1 tablet by mouth daily at 12 noon.       Cornelia Copa MD Attending [facility])

## 2016-06-23 NOTE — Progress Notes (Signed)
Left message with Glennon Mac, RN at [facility] [phone]) regarding Dr. Vergie Living request for patient's BP to be checked at home in one week.

## 2016-06-23 NOTE — Lactation Note (Addendum)
This note was copied from a baby's chart. Lactation Consultation Note  Patient Name: Jill Orozco [id number] Date: 06/23/2016 Reason for consult: Follow-up assessment Baby 47 hours old. Mom reports that baby's bilirubin will be checked in a couple of hours. Discussed how elevated bilirubin can make baby sleepy. Mom denies any issues with sleepiness at the breast and reports that the baby is gulping now when she nurses. Enc mom to offer STS and nurse with cues and at least every 3 hours since the baby now less than 6 pounds. Mom denies any issues with latching and reports that the baby just nursed within the last hour. Enc mom to call for assistance as needed with latching. Mom aware of OP/BFSG and LC phone line assistance after D/C.   Maternal Data    Feeding Feeding Type: Breast Fed Length of feed: 15 min  LATCH Score/Interventions Latch: Grasps breast easily, tongue down, lips flanged, rhythmical sucking.  Audible Swallowing: Spontaneous and intermittent  Type of Nipple: Everted at rest and after stimulation  Comfort (Breast/Nipple): Soft / non-tender     Hold (Positioning): No assistance needed to correctly position infant at breast.  LATCH Score: 10  Lactation Tools Discussed/Used     Consult Status Consult Status: PRN    Sherlyn Hay 06/23/2016, 11:59 AM

## 2016-06-23 NOTE — Progress Notes (Signed)
Pt discharged with printed instructions. Pt verbalized an understanding. No concerns noted. Dnya Hickle L Shahana Capes, RN 

## 2016-06-24 ENCOUNTER — Ambulatory Visit: Payer: Self-pay

## 2016-06-24 NOTE — Lactation Note (Addendum)
This note was copied from a baby's chart. Lactation Consultation Note RN called d/t baby weight loss 8% at 60 hrs. Moms milk is coming in viens distended in large breast. Mom has everted nipples. When entered rm. Mom Bf baby in football sitting position.  Mom shown how to use DEBP & how to disassemble, clean, & reassemble parts. Mom knows to pump q3h for 15-20 min. Demonstrated cleaning parts. Educated on milk coming in, breast filling, engorgement, supply and demand. Mom pumped 2 oz. Gave baby 30 ml w/slow flow nipple. Noted when swallowing, baby had back flash of milk from lips. Pace feeding demonstrated.  Baby on DPT. Noted when baby crying tongue curling up in the front w/frenulum noted.   Mom given supplementing information. RN received milk labels. Mom informed on milk storage and supplementing w/feeding amount according to age.  Massaged mom's breast w/easy flow of milk and softing of breast noted. Encouraged to pump again in 2-3 hours or sooner if breast are feeling heavy. Encouraged to BF first if baby cueing. Mom has several pacifiers in room. Giving to baby when she cries. Encouraged not to give pacifiers. Mom stated she knows but she needs them. Discussed satisfaction, supply and demand at breast. Patient Name: Jill Orozco [id number] Date: 06/24/2016 Reason for consult: Follow-up assessment;Breast/nipple pain   Maternal Data    Feeding Feeding Type: Breast Milk Length of feed: 20 min  LATCH Score/Interventions Latch: Grasps breast easily, tongue down, lips flanged, rhythmical sucking.  Audible Swallowing: Spontaneous and intermittent Intervention(s): Skin to skin;Hand expression;Alternate breast massage  Type of Nipple: Everted at rest and after stimulation  Comfort (Breast/Nipple): Soft / non-tender     Hold (Positioning): No assistance needed to correctly position infant at breast.  LATCH Score: 10  Lactation Tools Discussed/Used Tools: Pump Breast pump  type: Double-Electric Breast Pump Pump Review: Setup, frequency, and cleaning;Milk Storage Initiated by:: Peri Jefferson RN IBCLC Date initiated:: 06/24/16   Consult Status Consult Status: PRN Date: 06/24/16 Follow-up type: In-patient    Charyl Dancer 06/24/2016, 2:34 AM

## 2016-07-06 ENCOUNTER — Telehealth: Payer: Self-pay | Admitting: *Deleted

## 2016-07-06 NOTE — Telephone Encounter (Signed)
Dois Davenport called to report she was unable to f/u to recheck patient's BP, as patient was no longer in [facility], but would be in [place] for the next two weeks. She did advise pt to have her BP checked at a [facility] or similar place to make sure it is not too high.

## 2016-07-20 ENCOUNTER — Ambulatory Visit: Payer: Medicaid Other | Admitting: Medical

## 2016-07-27 ENCOUNTER — Ambulatory Visit (INDEPENDENT_AMBULATORY_CARE_PROVIDER_SITE_OTHER): Payer: Medicaid Other | Admitting: Medical

## 2016-07-27 ENCOUNTER — Encounter: Payer: Self-pay | Admitting: Medical

## 2016-07-27 DIAGNOSIS — I10 Essential (primary) hypertension: Secondary | ICD-10-CM

## 2016-07-27 NOTE — Progress Notes (Signed)
Subjective:     Jill Orozco is a 26 y.o. female who presents for a postpartum visit. She is 5 weeks postpartum following a spontaneous vaginal delivery. I have fully reviewed the prenatal and intrapartum course. The delivery was at 39 gestational weeks. Outcome: spontaneous vaginal delivery. Anesthesia: epidural. Postpartum course has been unremarkable. Baby's course has been unremarkable. Baby is feeding by both breast and bottle - Similac Alimentum. Bleeding red. Bowel function is normal. Bladder function is normal. Patient is not sexually active. Contraception method is none. Postpartum depression screening: negative.  The following portions of the patient's history were reviewed and updated as appropriate: allergies, current medications, past family history, past medical history, past social history, past surgical history and problem list.  Review of Systems Pertinent items are noted in HPI.   Objective:    BP 124/85   Pulse 88   Wt 280 lb 12.8 oz (127.4 kg)   LMP 07/25/2016 (Exact Date)   Breastfeeding? Yes   BMI 49.74 kg/m   General:  alert, cooperative, appears stated age and moderately obese   Breasts:  not performed  Lungs: clear to auscultation bilaterally  Heart:  regular rate and rhythm, S1, S2 normal, no murmur, click, rub or gallop  Abdomen: soft, nontender, no masses   Vulva:  normal  Vagina: normal vagina, no discharge, exudate, lesion, or erythema and small blood  Cervix:  no cervical motion tenderness, no lesions and normal contour  Corpus: normal size, contour, position, consistency, mobility, non-tender  Adnexa:  normal adnexa and no mass, fullness, tenderness  Rectal Exam: Not performed.        Assessment:     Normal postpartum exam. Pap smear not done at today's visit. Last pap smear 12/2015 was normal.  CHTN  Plan:    1. Contraception: condoms 2. Contact information for options for PCP to manage HTN given  3. Follow up in: 1 year or as needed.     Marny LowensteinWenzel, Jill Eastham N, PA-C 07/27/2016. Now

## 2016-07-27 NOTE — Patient Instructions (Signed)
Hypertension °Hypertension is another name for high blood pressure. High blood pressure forces your heart to work harder to pump blood. This can cause problems over time. °There are two numbers in a blood pressure reading. There is a top number (systolic) over a bottom number (diastolic). It is best to have a blood pressure below 120/80. Healthy choices can help lower your blood pressure. You may need medicine to help lower your blood pressure if: °· Your blood pressure cannot be lowered with healthy choices. °· Your blood pressure is higher than 130/80. °Follow these instructions at home: °Eating and drinking  °· If directed, follow the DASH eating plan. This diet includes: °¨ Filling half of your plate at each meal with fruits and vegetables. °¨ Filling one quarter of your plate at each meal with whole grains. Whole grains include whole wheat pasta, brown rice, and whole grain bread. °¨ Eating or drinking low-fat dairy products, such as skim milk or low-fat yogurt. °¨ Filling one quarter of your plate at each meal with low-fat (lean) proteins. Low-fat proteins include fish, skinless chicken, eggs, beans, and tofu. °¨ Avoiding fatty meat, cured and processed meat, or chicken with skin. °¨ Avoiding premade or processed food. °· Eat less than 1,500 mg of salt (sodium) a day. °· Limit alcohol use to no more than 1 drink a day for nonpregnant women and 2 drinks a day for men. One drink equals 12 oz of beer, 5 oz of wine, or 1½ oz of hard liquor. °Lifestyle  °· Work with your doctor to stay at a healthy weight or to lose weight. Ask your doctor what the best weight is for you. °· Get at least 30 minutes of exercise that causes your heart to beat faster (aerobic exercise) most days of the week. This may include walking, swimming, or biking. °· Get at least 30 minutes of exercise that strengthens your muscles (resistance exercise) at least 3 days a week. This may include lifting weights or pilates. °· Do not use any  products that contain nicotine or tobacco. This includes cigarettes and e-cigarettes. If you need help quitting, ask your doctor. °· Check your blood pressure at home as told by your doctor. °· Keep all follow-up visits as told by your doctor. This is important. °Medicines  °· Take over-the-counter and prescription medicines only as told by your doctor. Follow directions carefully. °· Do not skip doses of blood pressure medicine. The medicine does not work as well if you skip doses. Skipping doses also puts you at risk for problems. °· Ask your doctor about side effects or reactions to medicines that you should watch for. °Contact a doctor if: °· You think you are having a reaction to the medicine you are taking. °· You have headaches that keep coming back (recurring). °· You feel dizzy. °· You have swelling in your ankles. °· You have trouble with your vision. °Get help right away if: °· You get a very bad headache. °· You start to feel confused. °· You feel weak or numb. °· You feel faint. °· You get very bad pain in your: °¨ Chest. °¨ Belly (abdomen). °· You throw up (vomit) more than once. °· You have trouble breathing. °Summary °· Hypertension is another name for high blood pressure. °· Making healthy choices can help lower blood pressure. If your blood pressure cannot be controlled with healthy choices, you may need to take medicine. °This information is not intended to replace advice given to you by your   health care provider. Make sure you discuss any questions you have with your health care provider. °Document Released: 08/12/2007 Document Revised: 01/22/2016 Document Reviewed: 01/22/2016 °Elsevier Interactive Patient Education © 2017 Elsevier Inc. ° °

## 2016-12-26 ENCOUNTER — Encounter (HOSPITAL_COMMUNITY): Payer: Self-pay | Admitting: *Deleted

## 2016-12-26 ENCOUNTER — Emergency Department (HOSPITAL_COMMUNITY)
Admission: EM | Admit: 2016-12-26 | Discharge: 2016-12-26 | Disposition: A | Discharge status: Home / Self Care | Payer: Medicaid Other | Attending: Emergency Medicine | Admitting: Emergency Medicine

## 2016-12-26 ENCOUNTER — Emergency Department (HOSPITAL_COMMUNITY): Payer: Medicaid Other

## 2016-12-26 DIAGNOSIS — Y9383 Activity, rough housing and horseplay: Secondary | ICD-10-CM | POA: Diagnosis not present

## 2016-12-26 DIAGNOSIS — Z87891 Personal history of nicotine dependence: Secondary | ICD-10-CM | POA: Insufficient documentation

## 2016-12-26 DIAGNOSIS — Z79899 Other long term (current) drug therapy: Secondary | ICD-10-CM | POA: Diagnosis not present

## 2016-12-26 DIAGNOSIS — Y929 Unspecified place or not applicable: Secondary | ICD-10-CM | POA: Diagnosis not present

## 2016-12-26 DIAGNOSIS — S62655A Nondisplaced fracture of medial phalanx of left ring finger, initial encounter for closed fracture: Secondary | ICD-10-CM | POA: Insufficient documentation

## 2016-12-26 DIAGNOSIS — I10 Essential (primary) hypertension: Secondary | ICD-10-CM | POA: Insufficient documentation

## 2016-12-26 DIAGNOSIS — Y999 Unspecified external cause status: Secondary | ICD-10-CM | POA: Diagnosis not present

## 2016-12-26 DIAGNOSIS — W500XXA Accidental hit or strike by another person, initial encounter: Secondary | ICD-10-CM | POA: Diagnosis not present

## 2016-12-26 DIAGNOSIS — S6991XA Unspecified injury of right wrist, hand and finger(s), initial encounter: Secondary | ICD-10-CM | POA: Diagnosis present

## 2016-12-26 HISTORY — DX: Obesity, unspecified: E66.9

## 2016-12-26 MED ORDER — TRAMADOL HCL 50 MG PO TABS: 50.0000 mg | ORAL_TABLET | Freq: Four times a day (QID) | ORAL | 0 refills | Status: DC | PRN

## 2016-12-26 MED ORDER — IBUPROFEN 400 MG PO TABS
600.0000 mg | ORAL_TABLET | Freq: Once | ORAL | Status: AC
  Administered 2016-12-26: 20:00:00 600 mg via ORAL
  Filled 2016-12-26: qty 1

## 2016-12-26 NOTE — Progress Notes (Signed)
Orthopedic Tech Progress Note Patient Details:  Jill SimmeringLacresha Orozco 06/23/1990 [id number]  Ortho Devices Type of Ortho Device: Finger splint Ortho Device/Splint Location: lue 4th finger Ortho Device/Splint Interventions: Ordered, Application, Adjustment   Trinna PostMartinez, [name] 12/26/2016, 9:04 PM
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
## 2016-12-26 NOTE — Discharge Instructions (Signed)
Please read and follow all provided instructions.  You have been seen today for left ring finger pain.   Tests performed today include: An x-ray of the affected area - does show that you have a fracture of the volar plate to the left fourth middle phalangeal base.  Vital signs. See below for your results today.   Home care instructions: -- *PRICE in the first 24-48 hours after injury: Protect (with brace, splint, sling), if given by your provider - wear this till follow up with hand surgery.  Rest Ice- Do not apply ice pack directly to your skin, place towel or similar between your skin and ice/ice pack. Apply ice for 20 min, then remove for 40 min while awake Compression- Wear brace, elastic bandage, splint as directed by your provider Elevate affected extremity above the level of your heart when not walking around for the first 24-48 hours   For pain control you may take: 800mg  of ibuprofen (that is usually four 200mg  over the counter pills) up to 3 times a day (please take with food) and acetaminophen 975mg  (this is 3 normal strength, 325mg , over the counter pills) up to four times a day. Please do not take more than this. Do not drink alcohol or combine with other medications that have acetaminophen or Ibuprofen as an ingredient (Read the labels!).   For breakthrough pain you may take Tramadol. Do not drink alcohol drive or operate heavy machinery when taking. You are being provided a prescription for opiates (also known as narcotics) for pain control on an ?as needed? basis.  Opiates can be addictive and should only be used when absolutely necessary for pain control when other alternatives do not work.  We recommend you only use them for the recommended amount of time and only as prescribed.  Please do not take with other sedative medications or alcohol.  Please do not drive, operate machinery, or make important decisions while taking opiates.  Please note that these medications can be  addictive and have high abuse potential.  Please keep these medications locked away from children, teenagers or any family members with history of substance abuse. Additionally, these medications may cause constipation - take over the counter stool softeners or add fiber to your diet to treat this (Metamucil, Psyllium Fiber, Colace, Miralax) Further refills will need to be obtained from your primary care doctor and will not be prescribed through the Emergency Department. You will test positive on most drug tests while taking this medication.    Follow-up instructions: Please follow-up with hand surgeon. Call and make appointment tomorrow morning for this week.   Return instructions:  Please return if your toes or feet are numb or tingling, appear gray or blue, or you have severe pain (also elevate the leg and loosen splint or wrap if you were given one) Your finger swelling, turns red or you develop fever.  Please return to the Emergency Department if you experience worsening symptoms.  Please return if you have any other emergent concerns. Additional Information:  Your vital signs today were: BP 125/75 (BP Location: Right Arm)    Pulse 97    Temp 98.9 F (37.2 C) (Oral)    Resp 18    LMP 12/22/2016    SpO2 98%  If your blood pressure (BP) was elevated above 135/85 this visit, please have this repeated by your doctor within one month. ---------------

## 2016-12-26 NOTE — ED Triage Notes (Signed)
Pt reports injuring left ring finger yesterday and has pain and swelling.

## 2017-07-06 ENCOUNTER — Encounter: Payer: Self-pay | Admitting: *Deleted

## 2018-03-30 IMAGING — DX DG FINGER RING 2+V*L*
3 series · 3 of 3 positions shown · non-contrast
Comparison: None.

CLINICAL DATA: Pain and swelling after hyperextension injury of the
left ring finger last evening.

EXAM:
LEFT RING FINGER 2+V

[finger ap]
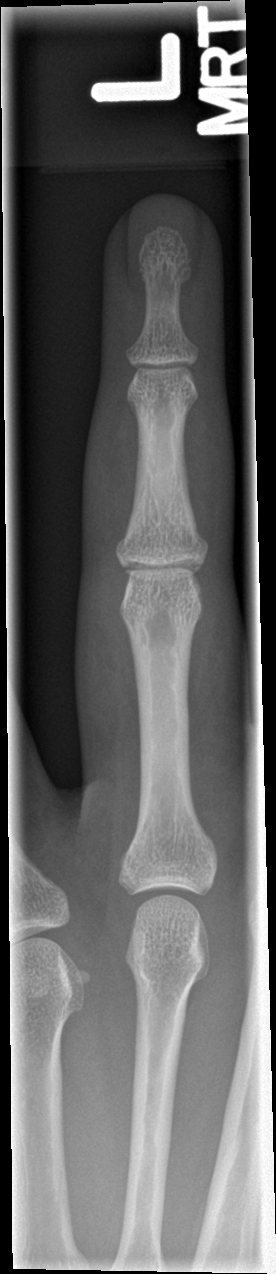

[finger obl]
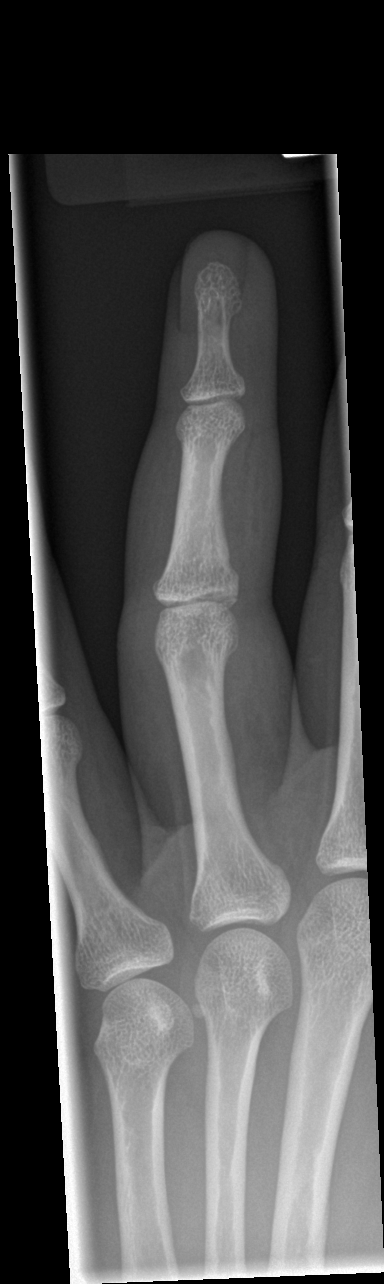

[finger lat]
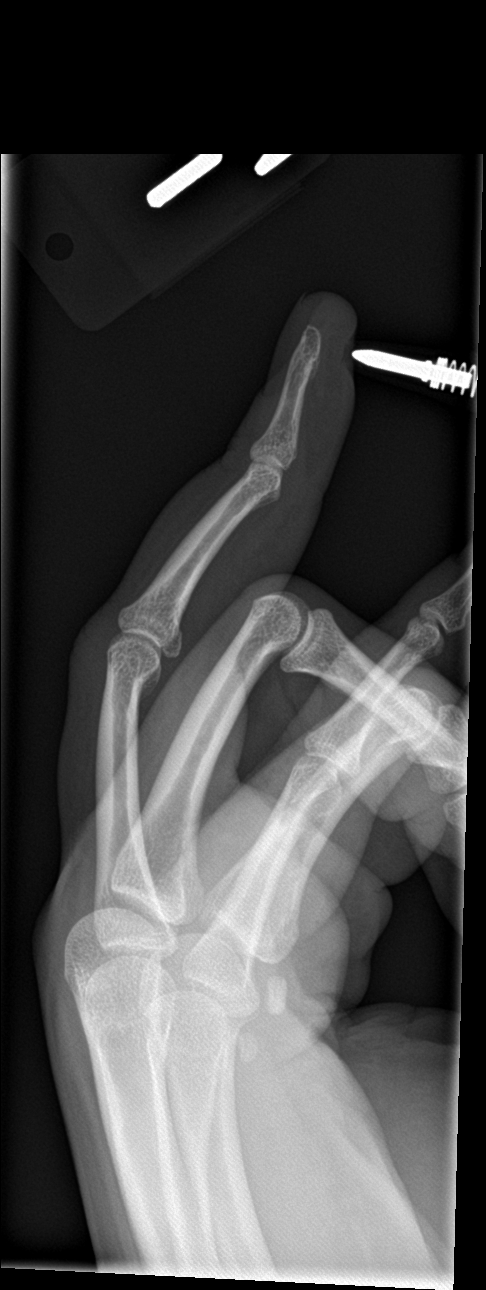

[3 of 3 positions shown; findings below may reference images not displayed]

FINDINGS: There is a volar plate fracture at the base of the left fourth
middle phalanx at the PIP joint with intra-articular extension into
the joint. No joint dislocations. There soft tissue swelling about
the PIP joint.
IMPRESSION: Subtle volar plate fracture of the left fourth middle phalangeal
base with intra-articular extension.

## 2019-07-20 ENCOUNTER — Ambulatory Visit: Payer: BC Managed Care – PPO | Admitting: Physician Assistant

## 2019-07-20 ENCOUNTER — Other Ambulatory Visit (INDEPENDENT_AMBULATORY_CARE_PROVIDER_SITE_OTHER): Payer: BC Managed Care – PPO

## 2019-07-20 ENCOUNTER — Other Ambulatory Visit: Payer: Self-pay

## 2019-07-20 ENCOUNTER — Encounter: Payer: Self-pay | Admitting: Physician Assistant

## 2019-07-20 VITALS — BP 138/86 | HR 93 | Temp 98.5°F | Resp 18 | Ht 65.0 in | Wt 315.0 lb

## 2019-07-20 DIAGNOSIS — F411 Generalized anxiety disorder: Secondary | ICD-10-CM | POA: Diagnosis not present

## 2019-07-20 DIAGNOSIS — G43009 Migraine without aura, not intractable, without status migrainosus: Secondary | ICD-10-CM | POA: Diagnosis not present

## 2019-07-20 DIAGNOSIS — Z6841 Body Mass Index (BMI) 40.0 and over, adult: Secondary | ICD-10-CM | POA: Diagnosis not present

## 2019-07-20 DIAGNOSIS — I10 Essential (primary) hypertension: Secondary | ICD-10-CM | POA: Diagnosis not present

## 2019-07-20 DIAGNOSIS — E611 Iron deficiency: Secondary | ICD-10-CM | POA: Diagnosis not present

## 2019-07-20 LAB — COMPREHENSIVE METABOLIC PANEL
ALT: 9 U/L (ref 0–35)
AST: 14 U/L (ref 0–37)
Albumin: 3.8 g/dL (ref 3.5–5.2)
Alkaline Phosphatase: 49 U/L (ref 39–117)
BUN: 12 mg/dL (ref 6–23)
CO2: 28 mEq/L (ref 19–32)
Calcium: 8.5 mg/dL (ref 8.4–10.5)
Chloride: 103 mEq/L (ref 96–112)
Creatinine, Ser: 0.53 mg/dL (ref 0.40–1.20)
GFR: 162.96 mL/min (ref >60.00)
Glucose, Bld: 87 mg/dL (ref 70–99)
Potassium: 3.9 mEq/L (ref 3.5–5.1)
Sodium: 137 mEq/L (ref 135–145)
Total Bilirubin: 0.7 mg/dL (ref 0.2–1.2)
Total Protein: 6.2 g/dL (ref 6.0–8.3)

## 2019-07-20 LAB — LIPID PANEL
Cholesterol: 117 mg/dL (ref 0–200)
HDL: 44.4 mg/dL (ref >39.00)
LDL Cholesterol: 65 mg/dL (ref 0–99)
NonHDL: 72.87
Total CHOL/HDL Ratio: 3
Triglycerides: 40 mg/dL (ref 0.0–149.0)
VLDL: 8 mg/dL (ref 0.0–40.0)

## 2019-07-20 LAB — CBC WITH DIFFERENTIAL/PLATELET
Basophils Absolute: 0 10*3/uL (ref 0.0–0.1)
Basophils Relative: 0.9 % (ref 0.0–3.0)
Eosinophils Absolute: 0.1 10*3/uL (ref 0.0–0.7)
Eosinophils Relative: 3.2 % (ref 0.0–5.0)
HCT: 33.7 % — ABNORMAL LOW (ref 36.0–46.0)
Hemoglobin: 11 g/dL — ABNORMAL LOW (ref 12.0–15.0)
Lymphocytes Relative: 27 % (ref 12.0–46.0)
Lymphs Abs: 0.7 10*3/uL (ref 0.7–4.0)
MCHC: 32.5 g/dL (ref 30.0–36.0)
MCV: 85.1 fl (ref 78.0–100.0)
Monocytes Absolute: 0.4 10*3/uL (ref 0.1–1.0)
Monocytes Relative: 13.4 % — ABNORMAL HIGH (ref 3.0–12.0)
Neutro Abs: 1.5 10*3/uL (ref 1.4–7.7)
Neutrophils Relative %: 55.5 % (ref 43.0–77.0)
Platelets: 174 10*3/uL (ref 150.0–400.0)
RBC: 3.96 Mil/uL (ref 3.87–5.11)
RDW: 15.7 % — ABNORMAL HIGH (ref 11.5–15.5)
WBC: 2.7 10*3/uL — ABNORMAL LOW (ref 4.0–10.5)

## 2019-07-20 LAB — HEMOGLOBIN A1C: Hgb A1c MFr Bld: 4.7 % (ref 4.6–6.5)

## 2019-07-20 LAB — TSH: TSH: 1.74 u[IU]/mL (ref 0.35–4.50)

## 2019-07-20 MED ORDER — FLUOXETINE HCL 20 MG PO TABS: 20.0000 mg | ORAL_TABLET | Freq: Every day | ORAL | 3 refills | Status: DC

## 2019-07-20 NOTE — Patient Instructions (Addendum)
I want you to keep well-hydrated. Try to follow the diet below to help with BP. It looked better on recheck today which is great! I would like for you to get a BP cuff to check BP at home a few times per week and record. Bring this to your next follow-up.   Please start the Fluoxetine once daily as directed.  Use the handouts given to schedule an appointment with counseling. I feel this will be very beneficial.   I have sent in a medication called Imitrex to use as directed when needed for migraine headaches.   Follow-up with me in 2 weeks.   It was very nice meeting you today. Welcome to [facility]!   DASH Eating Plan DASH stands for "Dietary Approaches to Stop Hypertension." The DASH eating plan is a healthy eating plan that has been shown to reduce high blood pressure (hypertension). It may also reduce your risk for type 2 diabetes, heart disease, and stroke. The DASH eating plan may also help with weight loss. What are tips for following this plan?  General guidelines  Avoid eating more than 2,300 mg (milligrams) of salt (sodium) a day. If you have hypertension, you may need to reduce your sodium intake to 1,500 mg a day.  Limit alcohol intake to no more than 1 drink a day for nonpregnant women and 2 drinks a day for men. One drink equals 12 oz of beer, 5 oz of wine, or 1 oz of hard liquor.  Work with your health care provider to maintain a healthy body weight or to lose weight. Ask what an ideal weight is for you.  Get at least 30 minutes of exercise that causes your heart to beat faster (aerobic exercise) most days of the week. Activities may include walking, swimming, or biking.  Work with your health care provider or diet and nutrition specialist (dietitian) to adjust your eating plan to your individual calorie needs. Reading food labels   Check food labels for the amount of sodium per serving. Choose foods with less than 5 percent of the Daily Value of sodium. Generally,  foods with less than 300 mg of sodium per serving fit into this eating plan.  To find whole grains, look for the word "whole" as the first word in the ingredient list. Shopping  Buy products labeled as "low-sodium" or "no salt added."  Buy fresh foods. Avoid canned foods and premade or frozen meals. Cooking  Avoid adding salt when cooking. Use salt-free seasonings or herbs instead of table salt or sea salt. Check with your health care provider or pharmacist before using salt substitutes.  Do not fry foods. Cook foods using healthy methods such as baking, boiling, grilling, and broiling instead.  Cook with heart-healthy oils, such as olive, canola, soybean, or sunflower oil. Meal planning  Eat a balanced diet that includes: ? 5 or more servings of fruits and vegetables each day. At each meal, try to fill half of your plate with fruits and vegetables. ? Up to 6-8 servings of whole grains each day. ? Less than 6 oz of lean meat, poultry, or fish each day. A 3-oz serving of meat is about the same size as a deck of cards. One egg equals 1 oz. ? 2 servings of low-fat dairy each day. ? A serving of nuts, seeds, or beans 5 times each week. ? Heart-healthy fats. Healthy fats called Omega-3 fatty acids are found in foods such as flaxseeds and coldwater fish, like sardines, salmon, and  mackerel.  Limit how much you eat of the following: ? Canned or prepackaged foods. ? Food that is high in trans fat, such as fried foods. ? Food that is high in saturated fat, such as fatty meat. ? Sweets, desserts, sugary drinks, and other foods with added sugar. ? Full-fat dairy products.  Do not salt foods before eating.  Try to eat at least 2 vegetarian meals each week.  Eat more home-cooked food and less restaurant, buffet, and fast food.  When eating at a restaurant, ask that your food be prepared with less salt or no salt, if possible. What foods are recommended? The items listed may not be a  complete list. Talk with your dietitian about what dietary choices are best for you. Grains Whole-grain or whole-wheat bread. Whole-grain or whole-wheat pasta. Brown rice. [place]row. Bulgur. Whole-grain and low-sodium cereals. Pita bread. Low-fat, low-sodium crackers. Whole-wheat flour tortillas. Vegetables Fresh or frozen vegetables (raw, steamed, roasted, or grilled). Low-sodium or reduced-sodium tomato and vegetable juice. Low-sodium or reduced-sodium tomato sauce and tomato paste. Low-sodium or reduced-sodium canned vegetables. Fruits All fresh, dried, or frozen fruit. Canned fruit in natural juice (without added sugar). Meat and other protein foods Skinless chicken or [place]. Ground chicken or [place]. Pork with fat trimmed off. Fish and seafood. Egg whites. Dried beans, peas, or lentils. Unsalted nuts, nut butters, and seeds. Unsalted canned beans. Lean cuts of beef with fat trimmed off. Low-sodium, lean deli meat. Dairy Low-fat (1%) or fat-free (skim) milk. Fat-free, low-fat, or reduced-fat cheeses. Nonfat, low-sodium ricotta or cottage cheese. Low-fat or nonfat yogurt. Low-fat, low-sodium cheese. Fats and oils Soft margarine without trans fats. Vegetable oil. Low-fat, reduced-fat, or light mayonnaise and salad dressings (reduced-sodium). Canola, safflower, olive, soybean, and sunflower oils. Avocado. Seasoning and other foods Herbs. Spices. Seasoning mixes without salt. Unsalted popcorn and pretzels. Fat-free sweets. What foods are not recommended? The items listed may not be a complete list. Talk with your dietitian about what dietary choices are best for you. Grains Baked goods made with fat, such as croissants, muffins, or some breads. Dry pasta or rice meal packs. Vegetables Creamed or fried vegetables. Vegetables in a cheese sauce. Regular canned vegetables (not low-sodium or reduced-sodium). Regular canned tomato sauce and paste (not low-sodium or reduced-sodium). Regular  tomato and vegetable juice (not low-sodium or reduced-sodium). Angie Fava. Olives. Fruits Canned fruit in a light or heavy syrup. Fried fruit. Fruit in cream or butter sauce. Meat and other protein foods Fatty cuts of meat. Ribs. Fried meat. Berniece Salines. Sausage. Bologna and other processed lunch meats. Salami. Fatback. Hotdogs. Bratwurst. Salted nuts and seeds. Canned beans with added salt. Canned or smoked fish. Whole eggs or egg yolks. Chicken or [place] with skin. Dairy Whole or 2% milk, cream, and half-and-half. Whole or full-fat cream cheese. Whole-fat or sweetened yogurt. Full-fat cheese. Nondairy creamers. Whipped toppings. Processed cheese and cheese spreads. Fats and oils Butter. Stick margarine. Lard. Shortening. Ghee. Bacon fat. Tropical oils, such as coconut, palm kernel, or palm oil. Seasoning and other foods Salted popcorn and pretzels. Onion salt, garlic salt, seasoned salt, table salt, and sea salt. Worcestershire sauce. Tartar sauce. Barbecue sauce. Teriyaki sauce. Soy sauce, including reduced-sodium. Steak sauce. Canned and packaged gravies. Fish sauce. Oyster sauce. Cocktail sauce. Horseradish that you find on the shelf. Ketchup. Mustard. Meat flavorings and tenderizers. Bouillon cubes. Hot sauce and Tabasco sauce. Premade or packaged marinades. Premade or packaged taco seasonings. Relishes. Regular salad dressings. Where to find more information:  National Heart, Lung,  and Blood Institute: https://wilson-eaton.com/  American Heart Association: www.heart.org Summary  The DASH eating plan is a healthy eating plan that has been shown to reduce high blood pressure (hypertension). It may also reduce your risk for type 2 diabetes, heart disease, and stroke.  With the DASH eating plan, you should limit salt (sodium) intake to 2,300 mg a day. If you have hypertension, you may need to reduce your sodium intake to 1,500 mg a day.  When on the DASH eating plan, aim to eat more fresh fruits and  vegetables, whole grains, lean proteins, low-fat dairy, and heart-healthy fats.  Work with your health care provider or diet and nutrition specialist (dietitian) to adjust your eating plan to your individual calorie needs. This information is not intended to replace advice given to you by your health care provider. Make sure you discuss any questions you have with your health care provider. Document Revised: 02/05/2017 Document Reviewed: 02/17/2016 Elsevier Patient Education  2020 [employer].

## 2019-07-20 NOTE — Progress Notes (Signed)
Patient presents to clinic today to establish care.  Acute Concerns: Patient endorses having significant issue over the past year with anxiety and tearfulness.  Notes some anhedonia without true depressed mood.  States symptoms started after she lost her stepfather suddenly.  Notes they were very close.  Notes that her stepfather died of an overdose and she nor her family were aware he had ever use drugs.  States this was very heartbreaking for her.  As such every time her phone rings she becomes anxious as she states she is always afraid she is going to receive bad news.  Other stressors include loss of her child's father 6 months ago: Another unexpected loss for her.  States her daughter will sometimes ask her where daddy is and states this "breaks her heart".  Notes that the stress and anxiety she is under has had an impact on her sleep, causing difficulty with both sleep onset and sleep maintenance.  Patient states she has been taking Benadryl and will have a nightly glass of wine to help her fall asleep.  Has never been treated previously for anxiety.  Has not seen counseling but is interested in this.  Notes she does have a good support system with her mother and siblings.  Denies suicidal thoughts ideation.  Chronic Issues: Hypertension -- initially diagnosed in mid-20s. Previously treated with medication but stopped medication after birth of her child in 2018. Patient denies chest pain, palpitations, lightheadedness, dizziness, vision changes.  Notes her diet is not the best but is trying to work on this.  Migraine --has had since her early 47s. Endorses headache about every 3 months. Usually stress is a major trigger for her. Typically L-sided, lasting a few hours. Notes some photophobia. Always starts with vision changes, then nausea, then headache. Previously used OTC Excedrin.  Was previously given a prescription for Toradol but states this did not help her.  No headache at present..    Health Maintenance: Immunizations --we will obtain records from GYN who is the only provider she seen in quite some time.  We will also look at the immunizations in the [facility]. PAP --endorses up-to-date.  Will obtain records from GYN  Past Medical History:  Diagnosis Date  . Hypertension   . Migraines   . Obesity     Past Surgical History:  Procedure Laterality Date  . NO PAST SURGERIES      Current Outpatient Medications on File Prior to Visit  Medication Sig Dispense Refill  . diphenhydrAMINE (BENADRYL) 25 mg capsule Take 25 mg by mouth every 6 (six) hours as needed.    . famotidine (PEPCID) 20 MG tablet Take 1 tablet (20 mg total) by mouth 2 (two) times daily. (Patient not taking: Reported on 07/27/2016) 60 tablet 3  . labetalol (NORMODYNE) 200 MG tablet Take 0.5 tablets (100 mg total) by mouth 2 (two) times daily. (Patient not taking: Reported on 07/20/2019) 60 tablet 3   No current facility-administered medications on file prior to visit.    No Known Allergies  Family History  Problem Relation Age of Onset  . Hypertension Mother   . Bipolar disorder Sister   . Anxiety disorder Brother   . Healthy Daughter   . Stroke Maternal Grandfather   . Cancer Paternal Grandfather        Thinks it was renal cancer  . Healthy Brother   . Healthy Brother     Social History   Socioeconomic History  . Marital status:  Single    Spouse name: Not on file  . Number of children: Not on file  . Years of education: Not on file  . Highest education level: Not on file  Occupational History  . Not on file  Tobacco Use  . Smoking status: Former Smoker    Types: Cigars  . Smokeless tobacco: Never Used  Substance and Sexual Activity  . Alcohol use: Yes    Alcohol/week: 7.0 standard drinks    Types: 7 Glasses of wine per week    Comment: 1 cup at night to sleep  . Drug use: No  . Sexual activity: Not Currently    Birth control/protection: None   Other Topics Concern  . Not on file  Social History Narrative  . Not on file   Social Determinants of Health   Financial Resource Strain:   . Difficulty of Paying Living Expenses:   Food Insecurity:   . Worried About Programme researcher, broadcasting/film/video in the Last Year:   . Barista in the Last Year:   Transportation Needs:   . Freight forwarder (Medical):   Marland Kitchen Lack of Transportation (Non-Medical):   Physical Activity:   . Days of Exercise per Week:   . Minutes of Exercise per Session:   Stress:   . Feeling of Stress :   Social Connections:   . Frequency of Communication with Friends and Family:   . Frequency of Social Gatherings with Friends and Family:   . Attends Religious Services:   . Active Member of Clubs or Organizations:   . Attends Banker Meetings:   Marland Kitchen Marital Status:   Intimate Partner Violence:   . Fear of Current or Ex-Partner:   . Emotionally Abused:   Marland Kitchen Physically Abused:   . Sexually Abused:    ROS  Pertinent ROS are listed in the HPI.  BP 138/86 (BP Location: Left Arm)   Pulse 93   Temp 98.5 F (36.9 C)   Resp 18   Ht 5\' 5"  (1.651 m)   Wt (!) 315 lb (142.9 kg)   LMP 07/02/2019   SpO2 99%   BMI 52.42 kg/m   Physical Exam Vitals reviewed.  Constitutional:      Appearance: Normal appearance.  HENT:     Head: Normocephalic and atraumatic.     Right Ear: Tympanic membrane normal.     Left Ear: Tympanic membrane normal.  Eyes:     Conjunctiva/sclera: Conjunctivae normal.     Pupils: Pupils are equal, round, and reactive to light.  Cardiovascular:     Rate and Rhythm: Normal rate and regular rhythm.     Pulses: Normal pulses.     Heart sounds: Normal heart sounds.  Pulmonary:     Effort: Pulmonary effort is normal.     Breath sounds: Normal breath sounds.  Musculoskeletal:     Cervical back: Neck supple.  Neurological:     General: No focal deficit present.     Mental Status: She is alert and oriented to person, place, and  time.  Psychiatric:        Attention and Perception: Attention normal.        Mood and Affect: Mood is anxious. Affect is tearful.        Speech: Speech normal.        Behavior: Behavior normal.        Judgment: Judgment normal.    Assessment/Plan: 1. Generalized anxiety disorder Patient agreeable to counseling and pharmacotherapy.  Handouts given on our counseling services and external counseling services.  She is going to call to set up an appointment.  We will check metabolic panel and thyroid today.  We will start fluoxetine at 20 mg once daily.  Plan on follow-up in 4 to 6 weeks regarding this.  Sooner if needed. - FLUoxetine (PROZAC) 20 MG tablet; Take 1 tablet (20 mg total) by mouth daily.  Dispense: 30 tablet; Refill: 3 - Comprehensive metabolic panel - TSH  2. Essential hypertension BP initially elevated.  On repeat is much improved at 138/86.  Still above goal.  We will start her on DASH diet.  We will also have her purchase a home blood pressure cuff to check blood pressure a few times per week and record.  She is to bring this with her to follow-up in 2 weeks for reassessment.  If home measurements are persistently elevated will start medication while we work on diet and exercise.  Labs today to include CBC, CMP, lipid panel and A1c to further risk stratify. - CBC with Differential/Platelet - Comprehensive metabolic panel - Hemoglobin A1c - Lipid panel  3. Migraine without aura and without status migrainosus, not intractable Infrequent.  No alarm signs or symptoms.  Rx Imitrex to use for abortive therapy.  Will monitor.  4. BMI 50.0-59.9, adult ([place]) Labs today as previously discussed to further risk stratify.  Once we get her anxiety under better control, we will start to work on dietary and exercise interventions to help with weight loss.  Also to consider bariatric referral, but there are more pressing issues at today's visit needing to be handled first.  This visit  occurred during the SARS-CoV-2 public health emergency.  Safety protocols were in place, including screening questions prior to the visit, additional usage of staff PPE, and extensive cleaning of exam room while observing appropriate contact time as indicated for disinfecting solutions.    Leeanne Rio, PA-C

## 2019-07-21 ENCOUNTER — Other Ambulatory Visit: Payer: Self-pay

## 2019-07-21 ENCOUNTER — Telehealth: Payer: Self-pay | Admitting: Physician Assistant

## 2019-07-21 LAB — IBC + FERRITIN
Ferritin: 23.6 ng/mL (ref 10.0–291.0)
Iron: 65 ug/dL (ref 42–145)
Saturation Ratios: 18.7 % — ABNORMAL LOW (ref 20.0–50.0)
Transferrin: 248 mg/dL (ref 212.0–360.0)

## 2019-07-21 MED ORDER — SUMATRIPTAN SUCCINATE 50 MG PO TABS: 50.0000 mg | ORAL_TABLET | Freq: Once | ORAL | 0 refills | Status: DC

## 2019-07-21 NOTE — Telephone Encounter (Signed)
Pt called in stating that she thought the Imitrex was going to be sent into the [facility] on [address]. Please advise

## 2019-07-21 NOTE — Telephone Encounter (Signed)
Prescription sent to pharmacy per PCP.

## 2019-07-26 LAB — HEPATITIS B SURFACE ANTIGEN: Hepatitis B Surface Ag: NEGATIVE

## 2019-07-26 LAB — HM PAP SMEAR: HM Pap smear: NEGATIVE

## 2019-07-26 LAB — HIV ANTIBODY (ROUTINE TESTING W REFLEX): HIV 1&2 Ab, 4th Generation: NONREACTIVE

## 2019-07-26 LAB — HM HEPATITIS C SCREENING LAB: HM Hepatitis Screen: NEGATIVE

## 2019-08-01 ENCOUNTER — Encounter: Payer: Self-pay | Admitting: Emergency Medicine

## 2019-08-02 ENCOUNTER — Ambulatory Visit (INDEPENDENT_AMBULATORY_CARE_PROVIDER_SITE_OTHER): Payer: BC Managed Care – PPO | Admitting: Physician Assistant

## 2019-08-02 ENCOUNTER — Encounter: Payer: Self-pay | Admitting: Physician Assistant

## 2019-08-02 ENCOUNTER — Other Ambulatory Visit: Payer: Self-pay

## 2019-08-02 VITALS — BP 124/80 | HR 100 | Temp 98.1°F | Resp 16 | Ht 65.0 in | Wt 312.0 lb

## 2019-08-02 DIAGNOSIS — I10 Essential (primary) hypertension: Secondary | ICD-10-CM

## 2019-08-02 NOTE — Patient Instructions (Signed)
Keep up the good work with diet! Work up to a goal of 150 minutes per week of aerobic exercise.   We will follow-up via video visit in 4-6 weeks to reassess mood/anxiety. Sooner if you need me.   Hang in there!   DASH Eating Plan DASH stands for "Dietary Approaches to Stop Hypertension." The DASH eating plan is a healthy eating plan that has been shown to reduce high blood pressure (hypertension). It may also reduce your risk for type 2 diabetes, heart disease, and stroke. The DASH eating plan may also help with weight loss. What are tips for following this plan?  General guidelines  Avoid eating more than 2,300 mg (milligrams) of salt (sodium) a day. If you have hypertension, you may need to reduce your sodium intake to 1,500 mg a day.  Limit alcohol intake to no more than 1 drink a day for nonpregnant women and 2 drinks a day for men. One drink equals 12 oz of beer, 5 oz of wine, or 1 oz of hard liquor.  Work with your health care provider to maintain a healthy body weight or to lose weight. Ask what an ideal weight is for you.  Get at least 30 minutes of exercise that causes your heart to beat faster (aerobic exercise) most days of the week. Activities may include walking, swimming, or biking.  Work with your health care provider or diet and nutrition specialist (dietitian) to adjust your eating plan to your individual calorie needs. Reading food labels   Check food labels for the amount of sodium per serving. Choose foods with less than 5 percent of the Daily Value of sodium. Generally, foods with less than 300 mg of sodium per serving fit into this eating plan.  To find whole grains, look for the word "whole" as the first word in the ingredient list. Shopping  Buy products labeled as "low-sodium" or "no salt added."  Buy fresh foods. Avoid canned foods and premade or frozen meals. Cooking  Avoid adding salt when cooking. Use salt-free seasonings or herbs instead of table  salt or sea salt. Check with your health care provider or pharmacist before using salt substitutes.  Do not fry foods. Cook foods using healthy methods such as baking, boiling, grilling, and broiling instead.  Cook with heart-healthy oils, such as olive, canola, soybean, or sunflower oil. Meal planning  Eat a balanced diet that includes: ? 5 or more servings of fruits and vegetables each day. At each meal, try to fill half of your plate with fruits and vegetables. ? Up to 6-8 servings of whole grains each day. ? Less than 6 oz of lean meat, poultry, or fish each day. A 3-oz serving of meat is about the same size as a deck of cards. One egg equals 1 oz. ? 2 servings of low-fat dairy each day. ? A serving of nuts, seeds, or beans 5 times each week. ? Heart-healthy fats. Healthy fats called Omega-3 fatty acids are found in foods such as flaxseeds and coldwater fish, like sardines, salmon, and mackerel.  Limit how much you eat of the following: ? Canned or prepackaged foods. ? Food that is high in trans fat, such as fried foods. ? Food that is high in saturated fat, such as fatty meat. ? Sweets, desserts, sugary drinks, and other foods with added sugar. ? Full-fat dairy products.  Do not salt foods before eating.  Try to eat at least 2 vegetarian meals each week.  Eat more home-cooked  food and less restaurant, buffet, and fast food.  When eating at a restaurant, ask that your food be prepared with less salt or no salt, if possible. What foods are recommended? The items listed may not be a complete list. Talk with your dietitian about what dietary choices are best for you. Grains Whole-grain or whole-wheat bread. Whole-grain or whole-wheat pasta. Brown rice. Oatmeal. Quinoa. Bulgur. Whole-grain and low-sodium cereals. Pita bread. Low-fat, low-sodium crackers. Whole-wheat flour tortillas. Vegetables Fresh or frozen vegetables (raw, steamed, roasted, or grilled). Low-sodium or  reduced-sodium tomato and vegetable juice. Low-sodium or reduced-sodium tomato sauce and tomato paste. Low-sodium or reduced-sodium canned vegetables. Fruits All fresh, dried, or frozen fruit. Canned fruit in natural juice (without added sugar). Meat and other protein foods Skinless chicken or turkey. Ground chicken or turkey. Pork with fat trimmed off. Fish and seafood. Egg whites. Dried beans, peas, or lentils. Unsalted nuts, nut butters, and seeds. Unsalted canned beans. Lean cuts of beef with fat trimmed off. Low-sodium, lean deli meat. Dairy Low-fat (1%) or fat-free (skim) milk. Fat-free, low-fat, or reduced-fat cheeses. Nonfat, low-sodium ricotta or cottage cheese. Low-fat or nonfat yogurt. Low-fat, low-sodium cheese. Fats and oils Soft margarine without trans fats. Vegetable oil. Low-fat, reduced-fat, or light mayonnaise and salad dressings (reduced-sodium). Canola, safflower, olive, soybean, and sunflower oils. Avocado. Seasoning and other foods Herbs. Spices. Seasoning mixes without salt. Unsalted popcorn and pretzels. Fat-free sweets. What foods are not recommended? The items listed may not be a complete list. Talk with your dietitian about what dietary choices are best for you. Grains Baked goods made with fat, such as croissants, muffins, or some breads. Dry pasta or rice meal packs. Vegetables Creamed or fried vegetables. Vegetables in a cheese sauce. Regular canned vegetables (not low-sodium or reduced-sodium). Regular canned tomato sauce and paste (not low-sodium or reduced-sodium). Regular tomato and vegetable juice (not low-sodium or reduced-sodium). Pickles. Olives. Fruits Canned fruit in a light or heavy syrup. Fried fruit. Fruit in cream or butter sauce. Meat and other protein foods Fatty cuts of meat. Ribs. Fried meat. Bacon. Sausage. Bologna and other processed lunch meats. Salami. Fatback. Hotdogs. Bratwurst. Salted nuts and seeds. Canned beans with added salt. Canned or  smoked fish. Whole eggs or egg yolks. Chicken or turkey with skin. Dairy Whole or 2% milk, cream, and half-and-half. Whole or full-fat cream cheese. Whole-fat or sweetened yogurt. Full-fat cheese. Nondairy creamers. Whipped toppings. Processed cheese and cheese spreads. Fats and oils Butter. Stick margarine. Lard. Shortening. Ghee. Bacon fat. Tropical oils, such as coconut, palm kernel, or palm oil. Seasoning and other foods Salted popcorn and pretzels. Onion salt, garlic salt, seasoned salt, table salt, and sea salt. Worcestershire sauce. Tartar sauce. Barbecue sauce. Teriyaki sauce. Soy sauce, including reduced-sodium. Steak sauce. Canned and packaged gravies. Fish sauce. Oyster sauce. Cocktail sauce. Horseradish that you find on the shelf. Ketchup. Mustard. Meat flavorings and tenderizers. Bouillon cubes. Hot sauce and Tabasco sauce. Premade or packaged marinades. Premade or packaged taco seasonings. Relishes. Regular salad dressings. Where to find more information:  National Heart, Lung, and Blood Institute: www.nhlbi.nih.gov  American Heart Association: www.heart.org Summary  The DASH eating plan is a healthy eating plan that has been shown to reduce high blood pressure (hypertension). It may also reduce your risk for type 2 diabetes, heart disease, and stroke.  With the DASH eating plan, you should limit salt (sodium) intake to 2,300 mg a day. If you have hypertension, you may need to reduce your sodium intake to 1,500 mg   a day.  When on the DASH eating plan, aim to eat more fresh fruits and vegetables, whole grains, lean proteins, low-fat dairy, and heart-healthy fats.  Work with your health care provider or diet and nutrition specialist (dietitian) to adjust your eating plan to your individual calorie needs. This information is not intended to replace advice given to you by your health care provider. Make sure you discuss any questions you have with your health care provider. Document  Revised: 02/05/2017 Document Reviewed: 02/17/2016 Elsevier Patient Education  2020 [employer].

## 2019-08-02 NOTE — Progress Notes (Signed)
Patient presents to clinic today for follow-up of hypertension. At last visit patient was noted to have BP at 138/86. Was encouraged to start DASH diet and get a home BP cuff to check BP levels. Instructed to follow-up in 2 weeks for reassessment to determine need for medication. Since last visit patient endorses working on her diet. Has cut out processed foods and has been eating more baked proteins, etc. Getting vegetable intake. Is keeping well-hydrated.   BP Readings from Last 3 Encounters:  08/02/19 124/80  07/20/19 138/86  12/26/16 135/81   Past Medical History:  Diagnosis Date  . Hypertension   . Migraines   . Obesity     Current Outpatient Medications on File Prior to Visit  Medication Sig Dispense Refill  . diphenhydrAMINE (BENADRYL) 25 mg capsule Take 25 mg by mouth every 6 (six) hours as needed.    Marland Kitchen FLUoxetine (PROZAC) 20 MG tablet Take 1 tablet (20 mg total) by mouth daily. 30 tablet 3  . SUMAtriptan (IMITREX) 50 MG tablet Take 1 tablet (50 mg total) by mouth once for 1 dose. May repeat in 2 hours if headache persists or recurs. 10 tablet 0   No current facility-administered medications on file prior to visit.    No Known Allergies  Family History  Problem Relation Age of Onset  . Hypertension Mother   . Bipolar disorder Sister   . Anxiety disorder Brother   . Healthy Daughter   . Stroke Maternal Grandfather   . Cancer Paternal Grandfather        Thinks it was renal cancer  . Healthy Brother   . Healthy Brother     Social History   Socioeconomic History  . Marital status: Single    Spouse name: Not on file  . Number of children: Not on file  . Years of education: Not on file  . Highest education level: Not on file  Occupational History  . Not on file  Tobacco Use  . Smoking status: Former Smoker    Types: Cigars  . Smokeless tobacco: Never Used  Substance and Sexual Activity  . Alcohol use: Yes    Alcohol/week: 7.0 standard drinks    Types: 7  Glasses of wine per week    Comment: 1 cup at night to sleep  . Drug use: No  . Sexual activity: Not Currently    Birth control/protection: None  Other Topics Concern  . Not on file  Social History Narrative  . Not on file   Social Determinants of Health   Financial Resource Strain:   . Difficulty of Paying Living Expenses:   Food Insecurity:   . Worried About Programme researcher, broadcasting/film/video in the Last Year:   . Barista in the Last Year:   Transportation Needs:   . Freight forwarder (Medical):   Marland Kitchen Lack of Transportation (Non-Medical):   Physical Activity:   . Days of Exercise per Week:   . Minutes of Exercise per Session:   Stress:   . Feeling of Stress :   Social Connections:   . Frequency of Communication with Friends and Family:   . Frequency of Social Gatherings with Friends and Family:   . Attends Religious Services:   . Active Member of Clubs or Organizations:   . Attends Banker Meetings:   Marland Kitchen Marital Status:    Review of Systems - See HPI.  All other ROS are negative.  BP 124/80   Pulse 100  Temp 98.1 F (36.7 C) (Temporal)   Resp 16   Ht 5\' 5"  (1.651 m)   Wt (!) 312 lb (141.5 kg)   SpO2 99%   Breastfeeding No   BMI 51.92 kg/m   Physical Exam Vitals reviewed.  Constitutional:      Appearance: Normal appearance.  HENT:     Head: Normocephalic and atraumatic.  Cardiovascular:     Rate and Rhythm: Normal rate and regular rhythm.     Pulses: Normal pulses.     Heart sounds: Normal heart sounds.  Pulmonary:     Effort: Pulmonary effort is normal.  Musculoskeletal:     Cervical back: Neck supple.  Neurological:     General: No focal deficit present.     Mental Status: She is alert.  Psychiatric:        Mood and Affect: Mood normal.     Recent Results (from the past 2160 hour(s))  CBC with Differential/Platelet     Status: Abnormal   Collection Time: 07/20/19 10:28 AM  Result Value Ref Range   WBC 2.7 (L) 4.0 - 10.5 K/uL   RBC  3.96 3.87 - 5.11 Mil/uL   Hemoglobin 11.0 (L) 12.0 - 15.0 g/dL   HCT 07/22/19 (L) 81.1 - 91.4 %   MCV 85.1 78.0 - 100.0 fl   MCHC 32.5 30.0 - 36.0 g/dL   RDW 78.2 (H) 95.6 - 21.3 %   Platelets 174.0 150.0 - 400.0 K/uL   Neutrophils Relative % 55.5 43.0 - 77.0 %   Lymphocytes Relative 27.0 12.0 - 46.0 %   Monocytes Relative 13.4 (H) 3.0 - 12.0 %   Eosinophils Relative 3.2 0.0 - 5.0 %   Basophils Relative 0.9 0.0 - 3.0 %   Neutro Abs 1.5 1.4 - 7.7 K/uL   Lymphs Abs 0.7 0.7 - 4.0 K/uL   Monocytes Absolute 0.4 0.1 - 1.0 K/uL   Eosinophils Absolute 0.1 0.0 - 0.7 K/uL   Basophils Absolute 0.0 0.0 - 0.1 K/uL  Comprehensive metabolic panel     Status: None   Collection Time: 07/20/19 10:28 AM  Result Value Ref Range   Sodium 137 135 - 145 mEq/L   Potassium 3.9 3.5 - 5.1 mEq/L   Chloride 103 96 - 112 mEq/L   CO2 28 19 - 32 mEq/L   Glucose, Bld 87 70 - 99 mg/dL   BUN 12 6 - 23 mg/dL   Creatinine, Ser 07/22/19 0.40 - 1.20 mg/dL   Total Bilirubin 0.7 0.2 - 1.2 mg/dL   Alkaline Phosphatase 49 39 - 117 U/L   AST 14 0 - 37 U/L   ALT 9 0 - 35 U/L   Total Protein 6.2 6.0 - 8.3 g/dL   Albumin 3.8 3.5 - 5.2 g/dL   GFR 5.78 469.62 mL/min   Calcium 8.5 8.4 - 10.5 mg/dL  Hemoglobin >95.28     Status: None   Collection Time: 07/20/19 10:28 AM  Result Value Ref Range   Hgb A1c MFr Bld 4.7 4.6 - 6.5 %    Comment: Glycemic Control Guidelines for People with Diabetes:Non Diabetic:  <6%Goal of Therapy: <7%Additional Action Suggested:  >8%   Lipid panel     Status: None   Collection Time: 07/20/19 10:28 AM  Result Value Ref Range   Cholesterol 117 0 - 200 mg/dL    Comment: ATP III Classification       Desirable:  < 200 mg/dL  Borderline High:  200 - 239 mg/dL          High:  > = 240 mg/dL   Triglycerides 40.0 0.0 - 149.0 mg/dL    Comment: Normal:  <150 mg/dLBorderline High:  150 - 199 mg/dL   HDL 44.40 >39.00 mg/dL   VLDL 8.0 0.0 - 40.0 mg/dL   LDL Cholesterol 65 0 - 99 mg/dL   Total  CHOL/HDL Ratio 3     Comment:                Men          Women1/2 Average Risk     3.4          3.3Average Risk          5.0          4.42X Average Risk          9.6          7.13X Average Risk          15.0          11.0                       NonHDL 72.87     Comment: NOTE:  Non-HDL goal should be 30 mg/dL higher than patient's LDL goal (i.e. LDL goal of < 70 mg/dL, would have non-HDL goal of < 100 mg/dL)  TSH     Status: None   Collection Time: 07/20/19 10:28 AM  Result Value Ref Range   TSH 1.74 0.35 - 4.50 uIU/mL  IBC + Ferritin     Status: Abnormal   Collection Time: 07/20/19  5:11 PM  Result Value Ref Range   Iron 65 42 - 145 ug/dL   Transferrin 248.0 212.0 - 360.0 mg/dL   Saturation Ratios 18.7 (L) 20.0 - 50.0 %   Ferritin 23.6 10.0 - 291.0 ng/mL  HIV Antibody (routine testing w rflx)     Status: None   Collection Time: 07/26/19 12:00 AM  Result Value Ref Range   HIV 1&2 Ab, 4th Generation Non reactive   HM HEPATITIS C SCREENING LAB     Status: None   Collection Time: 07/26/19 12:00 AM  Result Value Ref Range   HM Hepatitis Screen Negative-Validated   HM PAP SMEAR     Status: None   Collection Time: 07/26/19 12:00 AM  Result Value Ref Range   HM Pap smear Negative   Hepatitis B surface antigen     Status: None   Collection Time: 07/26/19 12:00 AM  Result Value Ref Range   Hepatitis B Surface Ag Negative    Assessment/Plan: 1. Chronic hypertension BP normotensive today with dietary changes. Asymptomatic. Continue DASH diet. Will continue to monitor. Has follow-up in 6 weeks for mood. Will reassess at that time.    This visit occurred during the SARS-CoV-2 public health emergency.  Safety protocols were in place, including screening questions prior to the visit, additional usage of staff PPE, and extensive cleaning of exam room while observing appropriate contact time as indicated for disinfecting solutions.     Leeanne Rio, PA-C

## 2019-09-06 ENCOUNTER — Ambulatory Visit: Payer: BC Managed Care – PPO | Admitting: Physician Assistant

## 2019-09-13 ENCOUNTER — Other Ambulatory Visit: Payer: Self-pay

## 2019-09-13 ENCOUNTER — Telehealth (INDEPENDENT_AMBULATORY_CARE_PROVIDER_SITE_OTHER): Payer: BC Managed Care – PPO | Admitting: Physician Assistant

## 2019-09-13 ENCOUNTER — Encounter: Payer: Self-pay | Admitting: Physician Assistant

## 2019-09-13 DIAGNOSIS — F411 Generalized anxiety disorder: Secondary | ICD-10-CM | POA: Insufficient documentation

## 2019-09-13 DIAGNOSIS — F419 Anxiety disorder, unspecified: Secondary | ICD-10-CM | POA: Diagnosis not present

## 2019-09-13 DIAGNOSIS — F5105 Insomnia due to other mental disorder: Secondary | ICD-10-CM | POA: Diagnosis not present

## 2019-09-13 MED ORDER — FLUOXETINE HCL 40 MG PO CAPS: 40.0000 mg | ORAL_CAPSULE | Freq: Every day | ORAL | 3 refills | Status: AC

## 2019-09-13 MED ORDER — SUMATRIPTAN SUCCINATE 50 MG PO TABS: 50.0000 mg | ORAL_TABLET | Freq: Once | ORAL | 0 refills | Status: AC

## 2019-09-13 NOTE — Progress Notes (Signed)
   Virtual Visit via Video   I connected with patient on 09/13/19 at  4:00 PM EDT by a video enabled telemedicine application and verified that I am speaking with the correct person using two identifiers.  Location patient: Home Location provider: [facility], Office Persons participating in the virtual visit: Patient, Provider, CMA (Patina Moore)  I discussed the limitations of evaluation and management by telemedicine and the availability of in person appointments. The patient expressed understanding and agreed to proceed.  Subjective:   HPI:   Patient presents via Caregility today to follow-up regarding GAD. Patient is currently on a regimen of Fluoxetine 20 mg daily. Endorses tolerating medication well without notes side effect.  Has noted improvement in overall anxiety levels with medication.  Still feels there is further improvement needed.  Denies panic attack.  Denies depressed mood or anhedonia.  Patient is having some issue with sleep onset.  Has been taking OTC Benadryl sometimes helps but often times not.  Does get 6 hours of sleep per night but is very interrupted sleep.   ROS:   See pertinent positives and negatives per HPI.  Patient Active Problem List   Diagnosis Date Noted  . Chronic hypertension 07/27/2016  . BMI 50.0-59.9, adult (HCC) 01/23/2016    Social History   Tobacco Use  . Smoking status: Former Smoker    Types: Cigars  . Smokeless tobacco: Never Used  Substance Use Topics  . Alcohol use: Yes    Alcohol/week: 7.0 standard drinks    Types: 7 Glasses of wine per week    Comment: 1 cup at night to sleep    Current Outpatient Medications:  .  FLUoxetine (PROZAC) 20 MG tablet, Take 1 tablet (20 mg total) by mouth daily., Disp: 30 tablet, Rfl: 3 .  SUMAtriptan (IMITREX) 50 MG tablet, Take 1 tablet (50 mg total) by mouth once for 1 dose. May repeat in 2 hours if headache persists or recurs., Disp: 10 tablet, Rfl: 0  No Known  Allergies  Objective:   There were no vitals taken for this visit.  Patient is well-developed, well-nourished in no acute distress.  Resting comfortably at home.  Head is normocephalic, atraumatic.  No labored breathing.  Speech is clear and coherent with logical content.  Patient is alert and oriented at baseline.   Assessment and Plan:   1. 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  Still with some breakthrough symptoms and sleep onset insomnia.  Will increase fluoxetine to 40 mg daily.  Sleep hygiene practices reviewed.  Recommend nightly 5 mg melatonin as well.  She is to follow-up with [place] in a couple weeks via phone or MyChart message to let us know how sleep is going.  We will follow-up regarding anxiety in 3 months, sooner if needed.    Piedad Climes, PA-C 09/13/2019

## 2019-09-13 NOTE — Progress Notes (Signed)
I have discussed the procedure for the virtual visit with the patient who has given consent to proceed with assessment and treatment.   Jill Orozco, CMA     

## 2019-09-13 NOTE — Patient Instructions (Signed)
Instructions sent to MyChart

## 2019-12-14 ENCOUNTER — Telehealth (INDEPENDENT_AMBULATORY_CARE_PROVIDER_SITE_OTHER): Payer: No Typology Code available for payment source | Admitting: Physician Assistant

## 2019-12-14 ENCOUNTER — Encounter: Payer: Self-pay | Admitting: Physician Assistant

## 2019-12-14 ENCOUNTER — Ambulatory Visit: Payer: No Typology Code available for payment source

## 2019-12-14 ENCOUNTER — Other Ambulatory Visit: Payer: Self-pay

## 2019-12-14 ENCOUNTER — Telehealth: Payer: Self-pay | Admitting: Physician Assistant

## 2019-12-14 DIAGNOSIS — R8290 Unspecified abnormal findings in urine: Secondary | ICD-10-CM

## 2019-12-14 LAB — URINALYSIS, ROUTINE W REFLEX MICROSCOPIC
Bilirubin Urine: NEGATIVE
Hgb urine dipstick: NEGATIVE
Ketones, ur: NEGATIVE
Leukocytes,Ua: NEGATIVE
Nitrite: NEGATIVE
Specific Gravity, Urine: 1.025 (ref 1.000–1.030)
Total Protein, Urine: NEGATIVE
Urine Glucose: NEGATIVE
Urobilinogen, UA: 0.2 (ref 0.0–1.0)
pH: 6 (ref 5.0–8.0)

## 2019-12-14 MED ORDER — CEPHALEXIN 500 MG PO CAPS: 500.0000 mg | ORAL_CAPSULE | Freq: Two times a day (BID) | ORAL | 0 refills | Status: AC

## 2019-12-14 NOTE — Progress Notes (Signed)
   Virtual Visit via Video   I connected with patient on 12/14/19 at  9:00 AM EDT by a video enabled telemedicine application and verified that I am speaking with the correct person using two identifiers.  Location patient: Home Location provider: [facility], Office Persons participating in the virtual visit: Patient, Provider, CMA (Patina Moore)  I discussed the limitations of evaluation and management by telemedicine and the availability of in person appointments. The patient expressed understanding and agreed to proceed.  Subjective:   HPI:   Patient presents via Caregility today c/o 1 week of urinary urgency, frequency and foul-smelling urine -- metallic per patient. Notes poor hydration. Denies dysuria or hematuria. Denies nausea/vomiting. Denies fever, chills. Denies back low back pain or abdominal pain. LMP ended 10/29. Denies vaginal symptoms or concern for STI.   ROS:   See pertinent positives and negatives per HPI.  Patient Active Problem List   Diagnosis Date Noted  . Generalized anxiety disorder 09/13/2019  . Chronic hypertension 07/27/2016  . BMI 50.0-59.9, adult (HCC) 01/23/2016    Social History   Tobacco Use  . Smoking status: Former Smoker    Types: Cigars  . Smokeless tobacco: Never Used  Substance Use Topics  . Alcohol use: Yes    Alcohol/week: 7.0 standard drinks    Types: 7 Glasses of wine per week    Comment: 1 cup at night to sleep    Current Outpatient Medications:  .  FLUoxetine (PROZAC) 40 MG capsule, Take 1 capsule (40 mg total) by mouth daily., Disp: 30 capsule, Rfl: 3 .  SUMAtriptan (IMITREX) 50 MG tablet, Take 1 tablet (50 mg total) by mouth once for 1 dose. May repeat in 2 hours if headache persists or recurs., Disp: 10 tablet, Rfl: 0  No Known Allergies  Objective:   There were no vitals taken for this visit.  Patient is well-developed, well-nourished in no acute distress.  Resting comfortably at home.  Head is  normocephalic, atraumatic.  No labored breathing.  Speech is clear and coherent with logical content.  Patient is alert and oriented at baseline.   Assessment and Plan:   1. Foul smelling urine With urine frequency and urgency. No note of hematuria or dysuria. Urine cloudy per patient. Most likely UTI but giving metallic smell noted by patient want to make sure no hematuria present in absence of infection. Will have her come by office this afternoon for UA and culture. She is to increase fluid intake. Will start Keflex BID x 7 days while awaiting culture. Strict office return precautions reviewed.  - Urinalysis, Routine w reflex microscopic - Urine Culture    Piedad Climes, PA-C 12/14/2019

## 2019-12-14 NOTE — Telephone Encounter (Signed)
Antibiotic sent to the patient preferred pharmacy.

## 2019-12-14 NOTE — Telephone Encounter (Signed)
Pt called in stating that the pharmacy never got the script for Keflex can we send that into the [facility] on [address] pt is at the pharmacy now

## 2019-12-14 NOTE — Addendum Note (Signed)
Addended by: Con Memos on: 12/14/2019 02:27 PM   Modules accepted: Orders

## 2019-12-16 LAB — URINE CULTURE
MICRO NUMBER:: 11047234
SPECIMEN QUALITY:: ADEQUATE

## 2019-12-19 ENCOUNTER — Ambulatory Visit: Payer: BC Managed Care – PPO | Admitting: Physician Assistant

## 2019-12-31 ENCOUNTER — Encounter: Payer: Self-pay | Admitting: Physician Assistant

## 2020-08-13 ENCOUNTER — Encounter: Payer: Self-pay | Admitting: Family Medicine

## 2020-08-13 ENCOUNTER — Telehealth (INDEPENDENT_AMBULATORY_CARE_PROVIDER_SITE_OTHER): Payer: No Typology Code available for payment source | Admitting: Family Medicine

## 2020-08-13 VITALS — Temp 102.0°F

## 2020-08-13 DIAGNOSIS — U071 COVID-19: Secondary | ICD-10-CM

## 2020-08-13 MED ORDER — BENZONATATE 200 MG PO CAPS: 200.0000 mg | ORAL_CAPSULE | Freq: Two times a day (BID) | ORAL | 0 refills | Status: AC | PRN

## 2020-08-13 NOTE — Patient Instructions (Addendum)
   ---------------------------------------------------------------------------------------------------------------------------      WORK SLIP:  Patient Jill Orozco,  01-17-1991, was seen for a medical visit today, 08/13/20 . Please excuse from work for a COVID like illness. We advise 10 days minimum from the onset of symptoms (08/07/20) PLUS 1 day of no fever and improved symptoms. Will defer to employer for a sooner return to work if symptoms have resolved, it is greater than 5 days since the positive test and the patient can wear a high-quality, tight fitting mask such as N95 or KN95 at all times for an additional 5 days. Would also suggest COVID19 antigen testing is negative prior to return.  Sincerely: E-signature: Dr. Kriste Basque, DO [facility] Ph: [phone]   ------------------------------------------------------------------------------------------------------------------------------  HOME CARE TIPS:  -I sent the medication(s) we discussed to your pharmacy: Meds ordered this encounter  Medications  . benzonatate (TESSALON) 200 MG capsule    Sig: Take 1 capsule (200 mg total) by mouth 2 (two) times daily as needed for cough.    Dispense:  20 capsule    Refill:  0    -can use tylenol or aleve if needed for fevers, aches and pains per instructions  -can use nasal saline a few times per day if you have nasal congestion; sometimes  a short course of Afrin nasal spray for 3 days can help with symptoms as well  -stay hydrated, drink plenty of fluids and eat small healthy meals - avoid dairy  -can take 1000 IU ( ) Vit D3 and 100-500 mg of Vit C daily per instructions  -follow up with your doctor in 2-3 days unless improving and feeling better  -stay home while sick, except to seek medical care. If you have COVID19, ideally it would be best to stay home for a full 10 days since the onset of symptoms PLUS one day of no fever and feeling better.  Wear a good mask that fits snugly (such as N95 or KN95) if around others to reduce the risk of transmission.  It was nice to meet you today, and I really hope you are feeling better soon. I help [facility] out with telemedicine visits on Tuesdays and Thursdays and am available for visits on those days. If you have any concerns or questions following this visit please schedule a follow up visit with your Primary Care doctor or seek care at a local urgent care clinic to avoid delays in care.    Seek in person care or schedule a follow up video visit promptly if your symptoms worsen, new concerns arise or you are not improving with treatment. Call 911 and/or seek emergency care if your symptoms are severe or life threatening.

## 2020-08-13 NOTE — Progress Notes (Signed)
Virtual Visit via Video Note  I connected with Jill Orozco  on 08/13/20 at  3:20 PM EDT by a video enabled telemedicine application and verified that I am speaking with the correct person using two identifiers.  Location patient: home, [place] Location provider:work or home office Persons participating in the virtual visit: patient, provider  I discussed the limitations of evaluation and management by telemedicine and the availability of in person appointments. The patient expressed understanding and agreed to proceed.   HPI:  Acute telemedicine visit for a cough: -Onset: 7- 8 days ago -Symptoms include: nasal congestion, NVD initially - now resolved, fever - resolved now, cough -mostly better, but the cough has lingered -Denies: SOB, CP, lingering NVD, inability to eat/drink/get out bed -Has tried: musinex -Pertinent past medical history: obesity -Pertinent medication allergies: No Known Allergies  -denies any chance of pregnancy, FDLMP 08/01/20 -COVID-19 vaccine status: vaccinated x 2  ROS: See pertinent positives and negatives per HPI.  Past Medical History:  Diagnosis Date  . Hypertension   . Migraines   . Obesity     Past Surgical History:  Procedure Laterality Date  . NO PAST SURGERIES       Current Outpatient Medications:  .  benzonatate (TESSALON) 200 MG capsule, Take 1 capsule (200 mg total) by mouth 2 (two) times daily as needed for cough., Disp: 20 capsule, Rfl: 0 .  FLUoxetine (PROZAC) 40 MG capsule, Take 1 capsule (40 mg total) by mouth daily., Disp: 30 capsule, Rfl: 3 .  SUMAtriptan (IMITREX) 50 MG tablet, Take 1 tablet (50 mg total) by mouth once for 1 dose. May repeat in 2 hours if headache persists or recurs., Disp: 10 tablet, Rfl: 0  EXAM:  VITALS per patient if applicable:  GENERAL: alert, oriented, appears well and in no acute distress  HEENT: atraumatic, conjunttiva clear, no obvious abnormalities on inspection of external nose and ears  NECK: normal  movements of the head and neck  LUNGS: on inspection no signs of respiratory distress, breathing rate appears normal, no obvious gross SOB, gasping or wheezing  CV: no obvious cyanosis  MS: moves all visible extremities without noticeable abnormality  PSYCH/NEURO: pleasant and cooperative, no obvious depression or anxiety, speech and thought processing grossly intact  ASSESSMENT AND PLAN:  Discussed the following assessment and plan:  COVID-19   Discussed treatment options, ideal treatment window, potential complications, isolation and precautions for COVID-19.  She is out of treatment window for oral antivirals and opted for symptomatic care. The patient did want a prescription for cough, Tessalon Rx sent.  Other symptomatic care measures summarized in patient instructions. Work/School slipped offered: provided in patient instructions   Advised to seek prompt in person care if worsening, new symptoms arise, or if is not improving with treatment. Discussed options for inperson care if PCP office not available. Did let this patient know that I only do telemedicine on Tuesdays and Thursdays for [place]. Advised to schedule follow up visit with PCP or [facility] if any further questions or concerns to avoid delays in care.   I discussed the assessment and treatment plan with the patient. The patient was provided an opportunity to ask questions and all were answered. The patient agreed with the plan and demonstrated an understanding of the instructions.     Terressa Koyanagi, DO
